# Patient Record
Sex: Female | Born: 1960 | Race: White | Hispanic: No | Marital: Married | Smoking: Former smoker
Health system: Southern US, Community
[De-identification: ages and names within clinical notes are randomized; demographics above are authoritative.]

## PROBLEM LIST (undated history)

## (undated) DIAGNOSIS — N83209 Unspecified ovarian cyst, unspecified side: Secondary | ICD-10-CM

## (undated) DIAGNOSIS — F32A Depression, unspecified: Secondary | ICD-10-CM

## (undated) DIAGNOSIS — T4145XA Adverse effect of unspecified anesthetic, initial encounter: Secondary | ICD-10-CM

## (undated) DIAGNOSIS — I10 Essential (primary) hypertension: Secondary | ICD-10-CM

## (undated) DIAGNOSIS — Z9889 Other specified postprocedural states: Secondary | ICD-10-CM

## (undated) DIAGNOSIS — T8859XA Other complications of anesthesia, initial encounter: Secondary | ICD-10-CM

## (undated) DIAGNOSIS — F329 Major depressive disorder, single episode, unspecified: Secondary | ICD-10-CM

## (undated) DIAGNOSIS — M797 Fibromyalgia: Secondary | ICD-10-CM

## (undated) DIAGNOSIS — I219 Acute myocardial infarction, unspecified: Secondary | ICD-10-CM

## (undated) DIAGNOSIS — R87619 Unspecified abnormal cytological findings in specimens from cervix uteri: Secondary | ICD-10-CM

## (undated) DIAGNOSIS — R112 Nausea with vomiting, unspecified: Secondary | ICD-10-CM

## (undated) HISTORY — DX: Unspecified ovarian cyst, unspecified side: N83.209

## (undated) HISTORY — PX: OVARIAN CYST SURGERY: SHX726

## (undated) HISTORY — DX: Fibromyalgia: M79.7

## (undated) HISTORY — PX: CERVICAL CONE BIOPSY: SUR198

## (undated) HISTORY — DX: Unspecified abnormal cytological findings in specimens from cervix uteri: R87.619

## (undated) HISTORY — PX: APPENDECTOMY: SHX54

## (undated) HISTORY — DX: Major depressive disorder, single episode, unspecified: F32.9

## (undated) HISTORY — DX: Acute myocardial infarction, unspecified: I21.9

## (undated) HISTORY — DX: Essential (primary) hypertension: I10

## (undated) HISTORY — DX: Depression, unspecified: F32.A

---

## 1992-10-28 DIAGNOSIS — R87619 Unspecified abnormal cytological findings in specimens from cervix uteri: Secondary | ICD-10-CM

## 1992-10-28 HISTORY — DX: Unspecified abnormal cytological findings in specimens from cervix uteri: R87.619

## 1994-10-28 HISTORY — PX: BACK SURGERY: SHX140

## 1998-02-07 ENCOUNTER — Ambulatory Visit (HOSPITAL_BASED_OUTPATIENT_CLINIC_OR_DEPARTMENT_OTHER): Admission: RE | Admit: 1998-02-07 | Discharge: 1998-02-07 | Payer: Self-pay | Admitting: Plastic Surgery

## 2000-02-21 ENCOUNTER — Other Ambulatory Visit: Admission: RE | Admit: 2000-02-21 | Discharge: 2000-02-21 | Payer: Self-pay | Admitting: Obstetrics and Gynecology

## 2000-10-15 ENCOUNTER — Encounter (INDEPENDENT_AMBULATORY_CARE_PROVIDER_SITE_OTHER): Payer: Self-pay | Admitting: Specialist

## 2000-10-15 ENCOUNTER — Ambulatory Visit (HOSPITAL_BASED_OUTPATIENT_CLINIC_OR_DEPARTMENT_OTHER): Admission: RE | Admit: 2000-10-15 | Discharge: 2000-10-15 | Payer: Self-pay | Admitting: Plastic Surgery

## 2003-04-28 HISTORY — PX: BOWEL RESECTION: SHX1257

## 2004-08-22 ENCOUNTER — Other Ambulatory Visit: Admission: RE | Admit: 2004-08-22 | Discharge: 2004-08-22 | Payer: Self-pay | Admitting: Obstetrics and Gynecology

## 2005-11-27 ENCOUNTER — Other Ambulatory Visit: Admission: RE | Admit: 2005-11-27 | Discharge: 2005-11-27 | Payer: Self-pay | Admitting: Obstetrics & Gynecology

## 2006-12-30 ENCOUNTER — Other Ambulatory Visit: Admission: RE | Admit: 2006-12-30 | Discharge: 2006-12-30 | Payer: Self-pay | Admitting: Obstetrics & Gynecology

## 2008-02-23 ENCOUNTER — Other Ambulatory Visit: Admission: RE | Admit: 2008-02-23 | Discharge: 2008-02-23 | Payer: Self-pay | Admitting: Obstetrics and Gynecology

## 2008-10-28 HISTORY — PX: ABDOMINOPLASTY/PANNICULECTOMY WITH LIPOSUCTION: SHX5577

## 2012-02-03 ENCOUNTER — Other Ambulatory Visit (HOSPITAL_COMMUNITY): Payer: Self-pay | Admitting: Oral and Maxillofacial Surgery

## 2012-02-03 DIAGNOSIS — R609 Edema, unspecified: Secondary | ICD-10-CM

## 2012-02-11 ENCOUNTER — Other Ambulatory Visit: Payer: Self-pay | Admitting: Radiology

## 2012-02-14 ENCOUNTER — Other Ambulatory Visit: Payer: Self-pay | Admitting: Physician Assistant

## 2012-02-14 ENCOUNTER — Other Ambulatory Visit (HOSPITAL_COMMUNITY): Payer: Self-pay

## 2012-02-17 ENCOUNTER — Encounter (HOSPITAL_COMMUNITY): Payer: Self-pay | Admitting: Pharmacy Technician

## 2012-02-18 ENCOUNTER — Other Ambulatory Visit: Payer: Self-pay | Admitting: Radiology

## 2012-02-19 ENCOUNTER — Other Ambulatory Visit (HOSPITAL_COMMUNITY): Payer: Self-pay | Admitting: Oral and Maxillofacial Surgery

## 2012-02-19 ENCOUNTER — Ambulatory Visit (HOSPITAL_COMMUNITY)
Admission: RE | Admit: 2012-02-19 | Discharge: 2012-02-19 | Disposition: A | Discharge status: Home / Self Care | Payer: 59 | Source: Ambulatory Visit | Attending: Oral and Maxillofacial Surgery | Admitting: Oral and Maxillofacial Surgery

## 2012-02-19 DIAGNOSIS — R609 Edema, unspecified: Secondary | ICD-10-CM

## 2012-02-19 DIAGNOSIS — K118 Other diseases of salivary glands: Secondary | ICD-10-CM | POA: Insufficient documentation

## 2012-04-27 HISTORY — PX: OTHER SURGICAL HISTORY: SHX169

## 2013-11-23 ENCOUNTER — Encounter: Payer: Self-pay | Admitting: Certified Nurse Midwife

## 2013-11-24 ENCOUNTER — Institutional Professional Consult (permissible substitution): Payer: Self-pay | Admitting: Certified Nurse Midwife

## 2013-11-24 ENCOUNTER — Telehealth: Payer: Self-pay | Admitting: Certified Nurse Midwife

## 2013-11-24 NOTE — Telephone Encounter (Signed)
Patient called and cancelled her AEX for today with D. Hollice Espy. I did not count this as a dnka as it was just scheduled yesterday. Patient will call back to reschedule.

## 2014-01-01 IMAGING — US US SOFT TISSUE HEAD/NECK
1 series · 9 of 9 positions shown · non-contrast
Comparison: Prior ultrasound of the neck dated 09/25/2011 and CT
of the neck dated 10/17/2011 performed at [facility] [REDACTED]

CLINICAL DATA: 13 mm hypoechoic nodule demonstrated in the right
submandibular region by prior ultrasound. The patient presents for
possible biopsy.

ULTRASOUND OF HEAD/NECK SOFT TISSUES
TECHNIQUE: Ultrasound examination of the head and neck soft
tissues was performed in the area of clinical concern.

[Series 1: us soft tissue head/neck · 0.08mm/px · 9 of 9 slices shown]
[im 1/9]
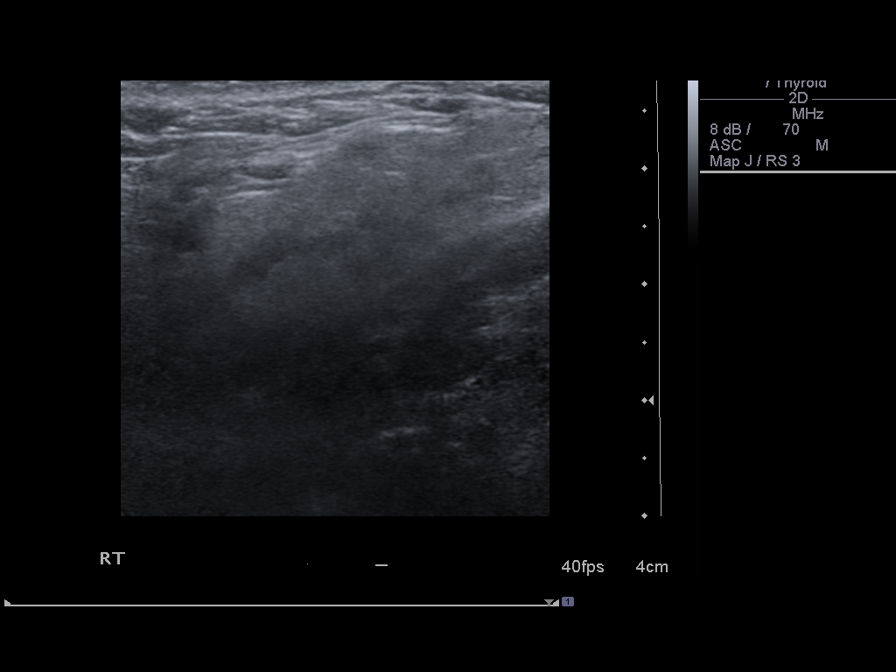
[im 2/9]
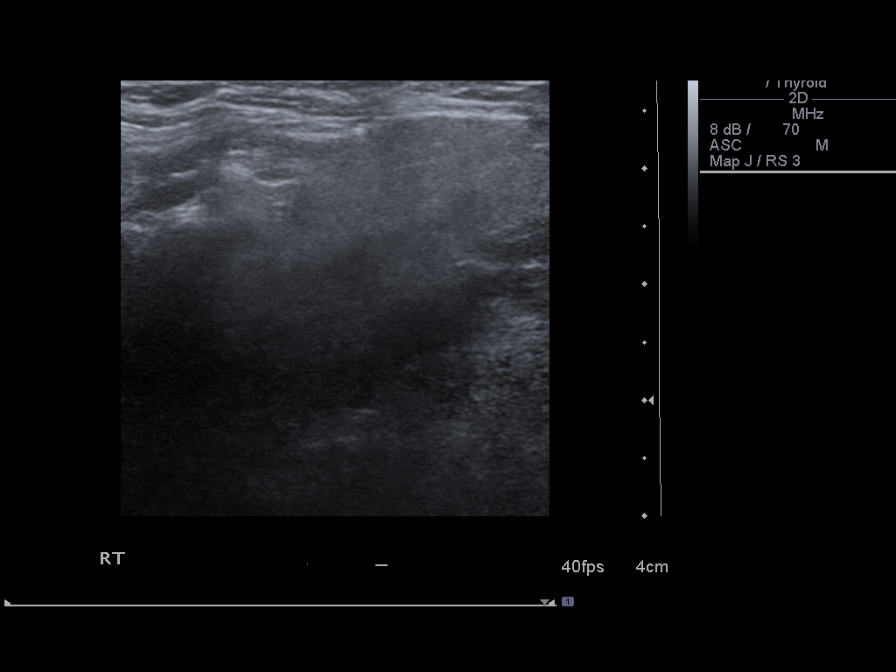
[im 3/9]
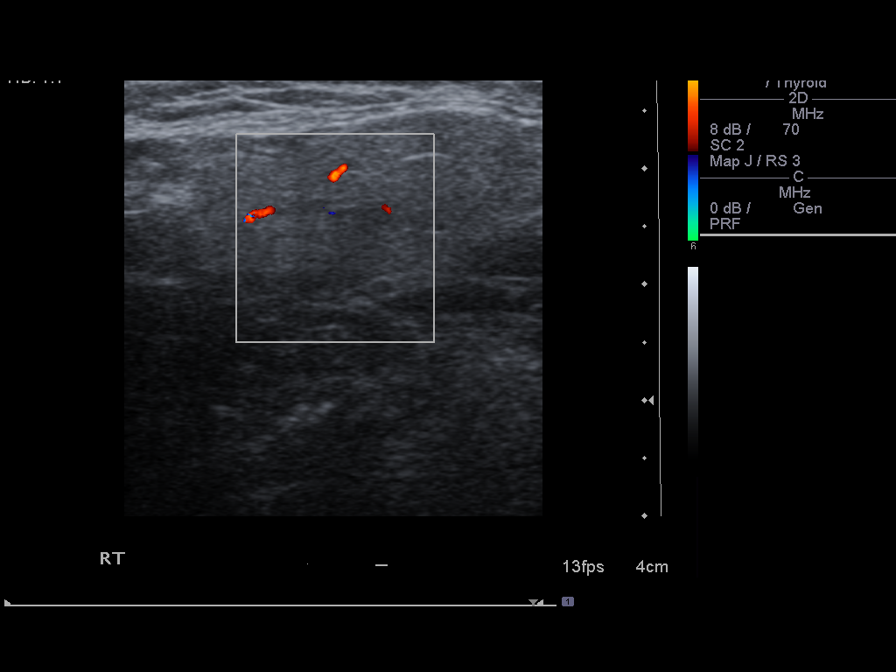
[im 4/9]
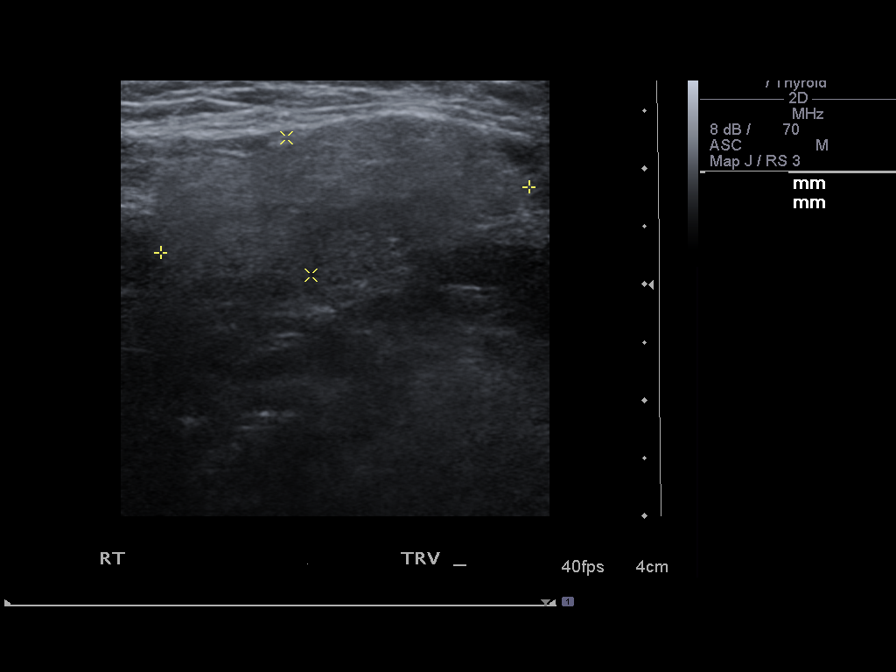
[im 5/9]
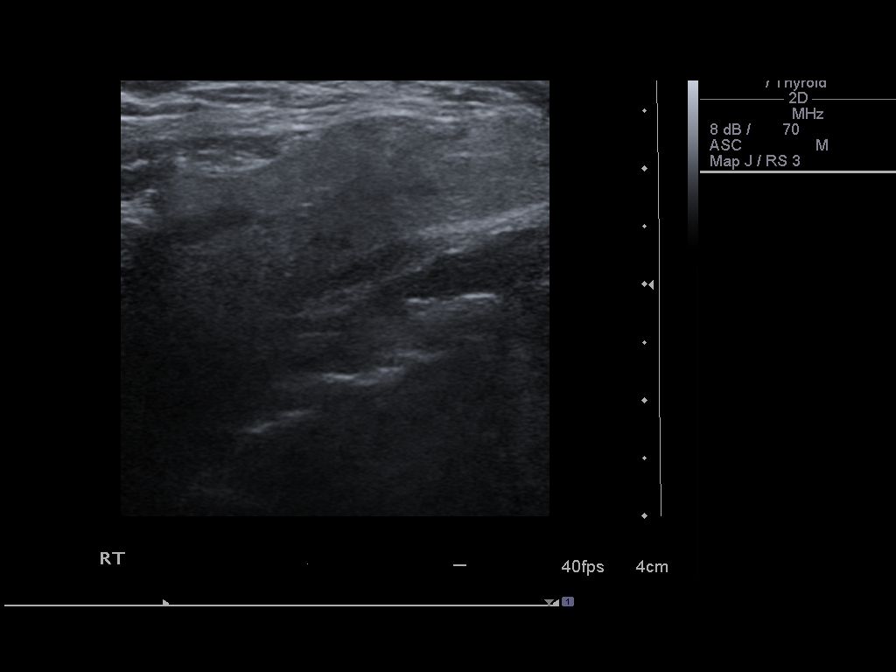
[im 6/9]
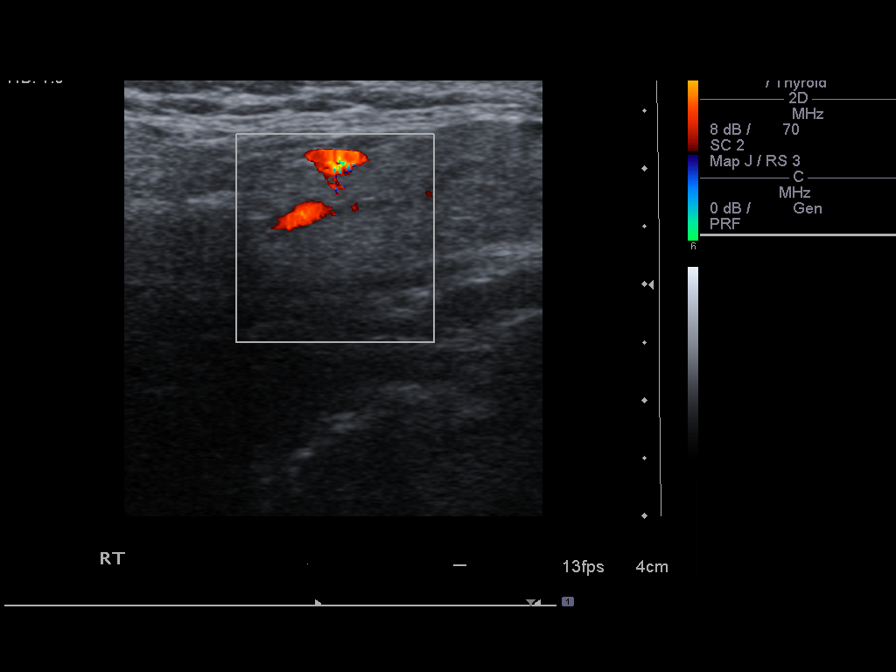
[im 7/9]
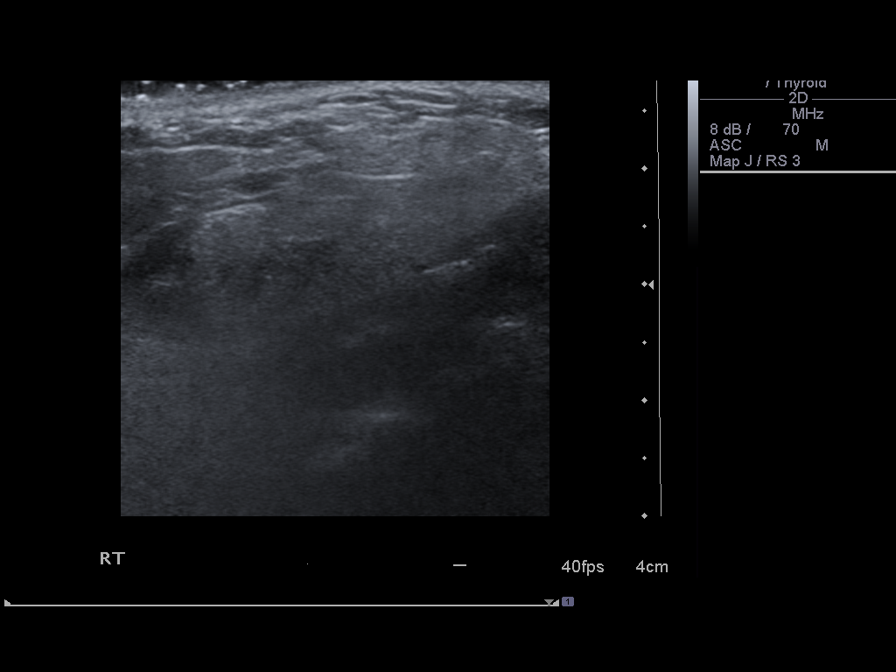
[im 8/9]
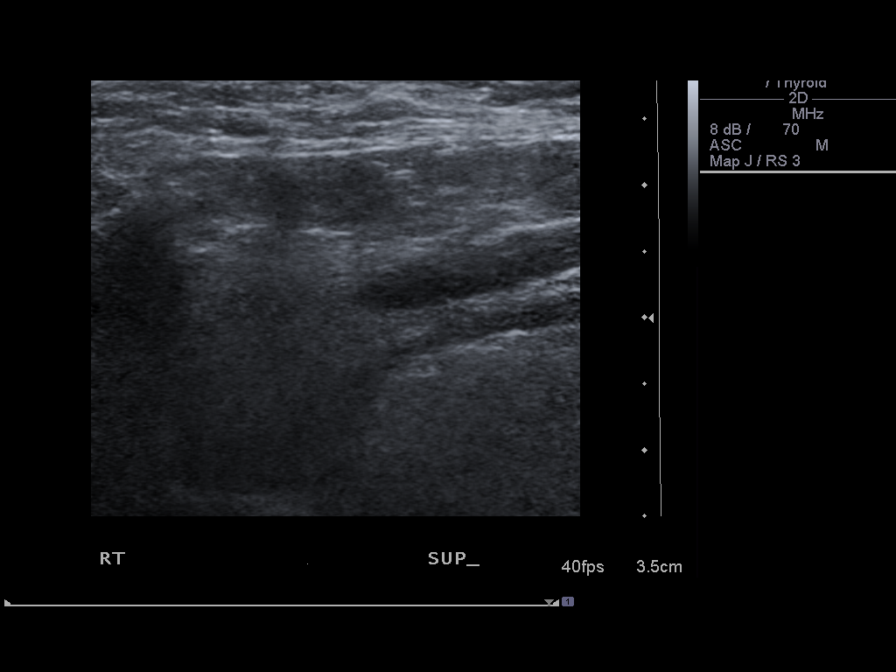
[im 9/9]
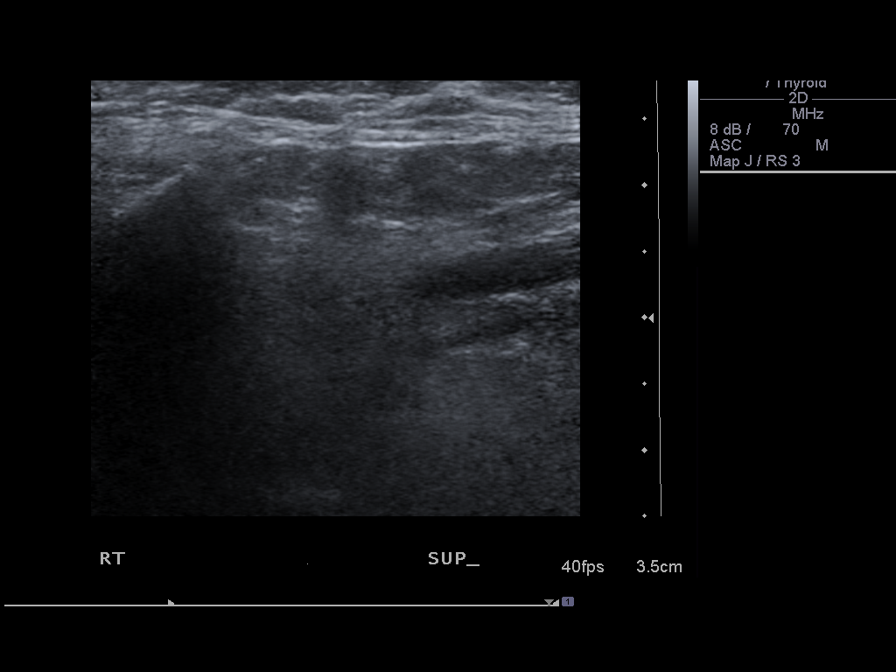

[9 of 9 positions shown; findings below may reference images not displayed]

FINDINGS: Ultrasound was performed in both submandibular regions.
In the right submandibular region, normal salivary tissue, muscle
and subcutaneous fat is present.  There is no evidence of an
abnormal hypoechoic mass or enlarged lymph node.  Biopsy was
therefore not performed.
IMPRESSION: Resolution of previously demonstrated hypoechoic soft tissue nodule
in the right submandibular region by prior ultrasound.  No abnormal
soft tissue mass is identified.

## 2014-07-13 ENCOUNTER — Ambulatory Visit (INDEPENDENT_AMBULATORY_CARE_PROVIDER_SITE_OTHER): Payer: 59 | Admitting: Certified Nurse Midwife

## 2014-07-13 ENCOUNTER — Encounter: Payer: Self-pay | Admitting: Certified Nurse Midwife

## 2014-07-13 VITALS — BP 116/70 | HR 72 | Resp 16 | Ht 63.25 in | Wt 167.0 lb

## 2014-07-13 DIAGNOSIS — N852 Hypertrophy of uterus: Secondary | ICD-10-CM

## 2014-07-13 DIAGNOSIS — Z01419 Encounter for gynecological examination (general) (routine) without abnormal findings: Secondary | ICD-10-CM

## 2014-07-13 DIAGNOSIS — Z124 Encounter for screening for malignant neoplasm of cervix: Secondary | ICD-10-CM

## 2014-07-13 NOTE — Progress Notes (Signed)
53 y.o. G46P3003 Married Caucasian Fe here for annual exam.  Menopausal no HRT. Denies vaginal bleeding or vaginal dryness. Continues to have some hot flashes, but less, no issues. Sees PCP for aex, medication management for Hypertension, and labs. All normal this year. History of fibroid uterus, not symptomatic. No health issues today.  Patient's last menstrual period was 05/28/2013.          Sexually active: Yes.    The current method of family planning is vasectomy.    Exercising: Yes.    exercise Smoker:  no  Health Maintenance: Pap:  05-28-12 neg HPVHR negative MMG:  11-24-13 composition category b, birads category 1:neg Colonoscopy: 10/12 polyp f/u 88yrs BMD:   2012 TDaP:  2013 Labs: none Self breast exam: done occ   reports that she has quit smoking. She does not have any smokeless tobacco history on file. She reports that she drinks about 6 ounces of alcohol per week. She reports that she does not use illicit drugs.  Past Medical History  Diagnosis Date  . Hypertension   . Abnormal Pap smear of cervix 1994    cin3  . Fibromyalgia   . Depression     Past Surgical History  Procedure Laterality Date  . Back surgery  1996  . Bowel resection  7/04  . Salivary gland removal Right 7/13  . Cervical cone biopsy  1993?    Current Outpatient Prescriptions  Medication Sig Dispense Refill  . cetirizine (ZYRTEC) 10 MG tablet Take 10 mg by mouth daily as needed. For seasonal allergies      . Cyanocobalamin (VITAMIN B-12 IJ) Inject as directed every 14 (fourteen) days.      . Diazepam (VALIUM PO) Take by mouth as needed.      Marland Kitchen escitalopram (LEXAPRO) 10 MG tablet Take 10 mg by mouth daily.      . fexofenadine (ALLEGRA) 60 MG tablet Take 60 mg by mouth daily as needed. For seasonal allergies      . ibuprofen (ADVIL,MOTRIN) 200 MG tablet Take 200 mg by mouth every 6 (six) hours as needed. For pain      . losartan-hydrochlorothiazide (HYZAAR) 50-12.5 MG per tablet Take 1 tablet by mouth  daily.      . Probiotic Product (ALIGN PO) Take by mouth as needed.       . zolpidem (AMBIEN) 5 MG tablet Take 2.5 mg by mouth at bedtime as needed. For sleep       No current facility-administered medications for this visit.    Family History  Problem Relation Age of Onset  . Hypertension Mother   . Stroke Mother   . Cancer Father     lung  . Hypertension Sister   . Hypertension Brother   . Hypertension Maternal Grandmother     ROS:  Pertinent items are noted in HPI.  Otherwise, a comprehensive ROS was negative.  Exam:   BP 116/70  Pulse 72  Resp 16  Ht 5' 3.25" (1.607 m)  Wt 167 lb (75.751 kg)  BMI 29.33 kg/m2  LMP 05/28/2013 Height: 5' 3.25" (160.7 cm)  Ht Readings from Last 3 Encounters:  07/13/14 5' 3.25" (1.607 m)    General appearance: alert, cooperative and appears stated age Head: Normocephalic, without obvious abnormality, atraumatic Neck: no adenopathy, supple, symmetrical, trachea midline and thyroid normal to inspection and palpation Lungs: clear to auscultation bilaterally Breasts: normal appearance, no masses or tenderness, No nipple retraction or dimpling, No nipple discharge or bleeding, No axillary  or supraclavicular adenopathy Heart: regular rate and rhythm Abdomen: soft, non-tender; no masses,  no organomegaly Extremities: extremities normal, atraumatic, no cyanosis or edema Skin: Skin color, texture, turgor normal. No rashes or lesions Lymph nodes: Cervical, supraclavicular, and axillary nodes normal. No abnormal inguinal nodes palpated Neurologic: Grossly normal   Pelvic: External genitalia:  no lesions              Urethra:  normal appearing urethra with no masses, tenderness or lesions              Bartholin's and Skene's: normal                 Vagina: normal appearing vagina with normal color and discharge, no lesions              Cervix: normal, non tender, no lesions              Pap taken: Yes.   Bimanual Exam:  Uterus:  enlarged, mid  position weeks size and non tender              Adnexa: normal adnexa and no mass, fullness, tenderness               Rectovaginal: Confirms               Anus:  normal sphincter tone, no lesions  A:  Well Woman with normal exam  Menopausal no HRT  History of fibroid uterus no size change  History of CIN 3 1994  Hypertension stable medication with PCP management  P:   Reviewed health and wellness pertinent to exam  Aware of need to evaluate if vaginal bleeding  Pap smear taken today with HPVHR  Continue follow up as indicated   counseled on breast self exam, mammography screening, menopause, adequate intake of calcium and vitamin D, diet and exercise  return annually or prn  An After Visit Summary was printed and given to the patient.

## 2014-07-13 NOTE — Patient Instructions (Signed)

## 2014-07-18 LAB — IPS PAP TEST WITH HPV

## 2014-07-18 NOTE — Progress Notes (Signed)
Reviewed personally.  M. Suzanne Alfa Leibensperger, MD.  

## 2014-08-29 ENCOUNTER — Encounter: Payer: Self-pay | Admitting: Certified Nurse Midwife

## 2015-01-02 DIAGNOSIS — Z0289 Encounter for other administrative examinations: Secondary | ICD-10-CM

## 2015-05-19 DIAGNOSIS — F418 Other specified anxiety disorders: Secondary | ICD-10-CM | POA: Insufficient documentation

## 2015-05-19 DIAGNOSIS — I10 Essential (primary) hypertension: Secondary | ICD-10-CM | POA: Insufficient documentation

## 2015-05-19 DIAGNOSIS — K219 Gastro-esophageal reflux disease without esophagitis: Secondary | ICD-10-CM | POA: Insufficient documentation

## 2015-05-19 DIAGNOSIS — I2510 Atherosclerotic heart disease of native coronary artery without angina pectoris: Secondary | ICD-10-CM | POA: Insufficient documentation

## 2015-06-01 DIAGNOSIS — Z955 Presence of coronary angioplasty implant and graft: Secondary | ICD-10-CM | POA: Insufficient documentation

## 2015-06-20 DIAGNOSIS — I2510 Atherosclerotic heart disease of native coronary artery without angina pectoris: Secondary | ICD-10-CM | POA: Insufficient documentation

## 2015-08-02 ENCOUNTER — Ambulatory Visit: Payer: Self-pay | Admitting: Certified Nurse Midwife

## 2015-08-02 ENCOUNTER — Encounter: Payer: Self-pay | Admitting: Certified Nurse Midwife

## 2015-08-14 DIAGNOSIS — I10 Essential (primary) hypertension: Secondary | ICD-10-CM | POA: Insufficient documentation

## 2015-08-31 DIAGNOSIS — K635 Polyp of colon: Secondary | ICD-10-CM | POA: Insufficient documentation

## 2015-11-21 ENCOUNTER — Ambulatory Visit: Payer: Self-pay | Admitting: Certified Nurse Midwife

## 2016-02-23 ENCOUNTER — Encounter: Payer: Self-pay | Admitting: Certified Nurse Midwife

## 2016-02-23 ENCOUNTER — Ambulatory Visit (INDEPENDENT_AMBULATORY_CARE_PROVIDER_SITE_OTHER): Payer: 59 | Admitting: Certified Nurse Midwife

## 2016-02-23 VITALS — Ht 63.25 in | Wt 156.0 lb

## 2016-02-23 DIAGNOSIS — N951 Menopausal and female climacteric states: Secondary | ICD-10-CM | POA: Diagnosis not present

## 2016-02-23 DIAGNOSIS — Z01419 Encounter for gynecological examination (general) (routine) without abnormal findings: Secondary | ICD-10-CM | POA: Diagnosis not present

## 2016-02-23 DIAGNOSIS — Z8742 Personal history of other diseases of the female genital tract: Secondary | ICD-10-CM | POA: Diagnosis not present

## 2016-02-23 DIAGNOSIS — Z124 Encounter for screening for malignant neoplasm of cervix: Secondary | ICD-10-CM

## 2016-02-23 NOTE — Patient Instructions (Signed)

## 2016-02-23 NOTE — Progress Notes (Signed)
55 y.o. G43P3003 Married  Caucasian Fe here for annual exam.  Menopausal no vaginal. Denies vaginal bleeding. Vaginal dryness at times. Sees Cardiology for follow up of heart attack and stent placement in 2016, doing well. Does her labs. Sees PCP for hypertension/cholesterol and anxiety /depression. Plans colonoscopy this year. Busy with family and grandchildren. No other health concerns today.  LMP    05/28/2013     Sexually active: Yes.    The current method of family planning is vasectomy.    Exercising: Yes.    walking Smoker:  no  Health Maintenance: Pap:  07-13-14 neg no endos, hpv hr neg MMG:  11-30-15 category b density,birads 1:neg Colonoscopy:  2012 polyp f/u 1yrs due this year BMD:   2012 TDaP:  2013 Shingles: no Pneumonia: no Hep C and HIV: not done declines Labs: none Self breast exam: done occ   reports that she has quit smoking. She does not have any smokeless tobacco history on file. She reports that she drinks about 4.8 oz of alcohol per week. She reports that she does not use illicit drugs.  Past Medical History  Diagnosis Date  . Hypertension   . Abnormal Pap smear of cervix 1994    cin3  . Fibromyalgia   . Depression     Past Surgical History  Procedure Laterality Date  . Back surgery  1996  . Bowel resection  7/04  . Salivary gland removal Right 7/13  . Cervical cone biopsy  1993?    Current Outpatient Prescriptions  Medication Sig Dispense Refill  . Acetaminophen (TYLENOL PO) Take by mouth as needed.    . ALPRAZolam (XANAX) 1 MG tablet   0  . aspirin 81 MG tablet Take 81 mg by mouth daily.    Marland Kitchen atorvastatin (LIPITOR) 40 MG tablet   3  . cetirizine (ZYRTEC) 10 MG tablet Take 10 mg by mouth daily as needed. For seasonal allergies    . cyanocobalamin (,VITAMIN B-12,) 1000 MCG/ML injection INJECT 1 ML INTO MUSCLE Q 2 WEEKS UTD  5  . EFFIENT 10 MG TABS tablet TK 1 T PO DAILY.  11  . escitalopram (LEXAPRO) 20 MG tablet     . fexofenadine (ALLEGRA) 60 MG  tablet Take 60 mg by mouth daily as needed. For seasonal allergies    . fluticasone (FLONASE) 50 MCG/ACT nasal spray SHAKE LQ AND U 2 SPRAYS IEN D FOR 10 DAYS  1  . losartan-hydrochlorothiazide (HYZAAR) 50-12.5 MG per tablet Take 1 tablet by mouth daily.    . Probiotic Product (ALIGN PO) Take by mouth as needed.     . Diazepam (VALIUM PO) Take by mouth as needed. Reported on 02/23/2016     No current facility-administered medications for this visit.    Family History  Problem Relation Age of Onset  . Hypertension Mother   . Stroke Mother   . Cancer Father     lung  . Hypertension Sister   . Hypertension Brother   . Hypertension Maternal Grandmother     ROS:  Pertinent items are noted in HPI.  Otherwise, a comprehensive ROS was negative.  Exam:   Ht 5' 3.25" (1.607 m)  Wt 156 lb (70.761 kg)  BMI 27.40 kg/m2  LMP  Height: 5' 3.25" (160.7 cm) Ht Readings from Last 3 Encounters:  02/23/16 5' 3.25" (1.607 m)  07/13/14 5' 3.25" (1.607 m)    General appearance: alert, cooperative and appears stated age Head: Normocephalic, without obvious abnormality, atraumatic Neck: no  adenopathy, supple, symmetrical, trachea midline and thyroid normal to inspection and palpation Lungs: clear to auscultation bilaterally Breasts: normal appearance, no masses or tenderness, No nipple retraction or dimpling, No nipple discharge or bleeding, No axillary or supraclavicular adenopathy Heart: regular rate and rhythm Abdomen: soft, non-tender; no masses,  no organomegaly Extremities: extremities normal, atraumatic, no cyanosis or edema Skin: Skin color, texture, turgor normal. No rashes or lesions Lymph nodes: Cervical, supraclavicular, and axillary nodes normal. No abnormal inguinal nodes palpated Neurologic: Grossly normal   Pelvic: External genitalia:  no lesions              Urethra:  normal appearing urethra with no masses, tenderness or lesions              Bartholin's and Skene's: normal                  Vagina: normal appearing vagina with normal color and discharge, no lesions              Cervix: normal,non tender, with cone biopsy appearance              Pap taken: Yes.   Bimanual Exam:  Uterus:  normal size, contour, position, consistency, mobility, non-tender and enlarged, 10-12  weeks size  History of fibroids              Adnexa: normal adnexa and no mass, fullness, tenderness               Rectovaginal: Confirms               Anus:  normal sphincter tone, no lesions  Chaperone present: yes  A:  Well Woman with normal exam  Menopausal no HRT  Vaginal dryness  Enlarged uterus no size change since last exam, history of fibroids  History of CIN 3 with [facility]  Hypertension/cholesterol/anxiety and depression management with MD  P:   Reviewed health and wellness pertinent to exam  Aware of need to advise if vaginal bleeding  Discussed OTC treatment for vaginal dryness and moisture for sexually activity. Patient does not feel it is a problem, just need to increase moisture at times. Patient can try coconut oil or [place] pearls. Will advise if no change or increases.  Discussed no change noted in size. Warning signs given with fibroids. Questions addressed.  Continue with follow up as indicated.  Pap smear as above with HPVHR   counseled on breast self exam, mammography screening, menopause, adequate intake of calcium and vitamin D, diet and exercise  return annually or prn  An After Visit Summary was printed and given to the patient.

## 2016-02-25 NOTE — Progress Notes (Signed)
Encounter reviewed Karielle Davidow, MD   

## 2016-02-27 LAB — IPS PAP TEST WITH HPV

## 2016-03-05 DIAGNOSIS — E7800 Pure hypercholesterolemia, unspecified: Secondary | ICD-10-CM | POA: Insufficient documentation

## 2017-02-27 ENCOUNTER — Encounter: Payer: Self-pay | Admitting: Certified Nurse Midwife

## 2017-08-15 ENCOUNTER — Ambulatory Visit (INDEPENDENT_AMBULATORY_CARE_PROVIDER_SITE_OTHER): Payer: 59 | Admitting: Certified Nurse Midwife

## 2017-08-15 ENCOUNTER — Encounter: Payer: Self-pay | Admitting: Certified Nurse Midwife

## 2017-08-15 VITALS — BP 102/60 | HR 88 | Resp 14 | Ht 63.25 in | Wt 165.0 lb

## 2017-08-15 DIAGNOSIS — Z01419 Encounter for gynecological examination (general) (routine) without abnormal findings: Secondary | ICD-10-CM | POA: Diagnosis not present

## 2017-08-15 DIAGNOSIS — E538 Deficiency of other specified B group vitamins: Secondary | ICD-10-CM | POA: Insufficient documentation

## 2017-08-15 DIAGNOSIS — N951 Menopausal and female climacteric states: Secondary | ICD-10-CM

## 2017-08-15 DIAGNOSIS — Z87898 Personal history of other specified conditions: Secondary | ICD-10-CM | POA: Insufficient documentation

## 2017-08-15 DIAGNOSIS — K589 Irritable bowel syndrome without diarrhea: Secondary | ICD-10-CM | POA: Insufficient documentation

## 2017-08-15 DIAGNOSIS — Z86018 Personal history of other benign neoplasm: Secondary | ICD-10-CM

## 2017-08-15 DIAGNOSIS — N852 Hypertrophy of uterus: Secondary | ICD-10-CM | POA: Diagnosis not present

## 2017-08-15 DIAGNOSIS — H35049 Retinal micro-aneurysms, unspecified, unspecified eye: Secondary | ICD-10-CM | POA: Insufficient documentation

## 2017-08-15 DIAGNOSIS — D219 Benign neoplasm of connective and other soft tissue, unspecified: Secondary | ICD-10-CM | POA: Insufficient documentation

## 2017-08-15 DIAGNOSIS — Q0700 Arnold-Chiari syndrome without spina bifida or hydrocephalus: Secondary | ICD-10-CM | POA: Insufficient documentation

## 2017-08-15 DIAGNOSIS — M797 Fibromyalgia: Secondary | ICD-10-CM | POA: Insufficient documentation

## 2017-08-15 DIAGNOSIS — G4700 Insomnia, unspecified: Secondary | ICD-10-CM | POA: Insufficient documentation

## 2017-08-15 NOTE — Progress Notes (Signed)
56 y.o. G73P3003 Married  Caucasian Fe here for annual exam. Menopausal no HRT. Denies vaginal bleeding or vaginal dryness. Some hot flashes and night sweats, but no issues. Does not feel fibroids have changed, no symptom changes. Continues follow up with cardiology since heart attack and stent placement. Continues to work of diet and weight to stay healthy. Feels like her exercise is with family care provided.  Sees PCP for cholesterol, anxiety/depression and hypertension management, all stable per patient. Had planned colonoscopy last year, but her health was not ready to do. Plans to do before year end. No other health issues today.  Patient's last menstrual period was 05/28/2013.          Sexually active: Yes.    The current method of family planning is vasectomy.    Exercising: No.  The patient does not participate in regular exercise at present. Smoker:  no  Health Maintenance: Pap:  02-23-16 neg HPV HR neg History of Abnormal Pap: yes, years ago MMG:  02-20-17 category b density birads 2:neg Self Breast exams: no Colonoscopy:  2012 f/u 30yrs BMD:   2012 TDaP:  2013 Shingles: n/a Pneumonia: n/a Hep C: done with PCP -- negative Labs: PCP   reports that she has quit smoking. She has never used smokeless tobacco. She reports that she drinks about 4.8 oz of alcohol per week . She reports that she does not use drugs.  Past Medical History:  Diagnosis Date  . Abnormal Pap smear of cervix 1994   cin3  . Depression   . Fibromyalgia   . Heart attack ([place])   . Hypertension     Past Surgical History:  Procedure Laterality Date  . BACK SURGERY  1996  . BOWEL RESECTION  7/04  . CERVICAL CONE BIOPSY  1993?  . salivary gland removal Right 7/13    Current Outpatient Prescriptions  Medication Sig Dispense Refill  . Acetaminophen (TYLENOL PO) Take by mouth as needed.    . ALPRAZolam (XANAX) 1 MG tablet   0  . aspirin 81 MG tablet Take 81 mg by mouth daily.    Marland Kitchen azithromycin (ZITHROMAX)  250 MG tablet Take two tablets on the first day and then one tablet every day after.    . bifidobacterium infantis (ALIGN) capsule Take 4 mg by mouth as needed.    . carvedilol (COREG) 6.25 MG tablet Take 3.2 mg by mouth at bedtime.    . cetirizine (ZYRTEC) 10 MG tablet Take 10 mg by mouth daily as needed. For seasonal allergies    . cyanocobalamin (,VITAMIN B-12,) 1000 MCG/ML injection INJECT 1 ML INTO MUSCLE Q 2 WEEKS UTD  5  . Diazepam (VALIUM PO) Take by mouth as needed. Reported on 02/23/2016    . EFFIENT 10 MG TABS tablet TK 1 T PO DAILY.  11  . escitalopram (LEXAPRO) 20 MG tablet     . fexofenadine (ALLEGRA) 60 MG tablet Take 60 mg by mouth daily as needed. For seasonal allergies    . fluticasone (FLONASE) 50 MCG/ACT nasal spray SHAKE LQ AND U 2 SPRAYS IEN D FOR 10 DAYS  1  . hyoscyamine (LEVSIN SL) 0.125 MG SL tablet Take 0.125 mg by mouth as needed.    Marland Kitchen losartan-hydrochlorothiazide (HYZAAR) 50-12.5 MG per tablet Take 1 tablet by mouth daily.    . metroNIDAZOLE (METROGEL) 0.75 % gel as needed.    . nitroGLYCERIN (NITROSTAT) 0.4 MG SL tablet Place under the tongue as needed.    . Probiotic  Product (ALIGN PO) Take by mouth as needed.     . rosuvastatin (CRESTOR) 20 MG tablet Take 20 mg by mouth daily.     No current facility-administered medications for this visit.     Family History  Problem Relation Age of Onset  . Hypertension Mother   . Stroke Mother   . Cancer Father        lung  . Hypertension Sister   . Hypertension Brother   . Hypertension Maternal Grandmother   . Colon cancer Maternal Aunt     ROS:  Pertinent items are noted in HPI.  Otherwise, a comprehensive ROS was negative.  Exam:   BP 102/60 (BP Location: Right Arm, Patient Position: Sitting, Cuff Size: Normal)   Pulse 88   Resp 14   Ht 5' 3.25" (1.607 m)   Wt 165 lb (74.8 kg)   LMP 05/28/2013   BMI 29.00 kg/m  Height: 5' 3.25" (160.7 cm) Ht Readings from Last 3 Encounters:  08/15/17 5' 3.25" (1.607 m)   02/23/16 5' 3.25" (1.607 m)  07/13/14 5' 3.25" (1.607 m)    General appearance: alert, cooperative and appears stated age Head: Normocephalic, without obvious abnormality, atraumatic Neck: no adenopathy, supple, symmetrical, trachea midline and thyroid normal to inspection and palpation Lungs: clear to auscultation bilaterally Breasts: normal appearance, no masses or tenderness, No nipple retraction or dimpling, No nipple discharge or bleeding, No axillary or supraclavicular adenopathy Heart: regular rate and rhythm Abdomen: soft, non-tender; no masses,  no organomegaly Extremities: extremities normal, atraumatic, no cyanosis or edema Skin: Skin color, texture, turgor normal. No rashes or lesions Lymph nodes: Cervical, supraclavicular, and axillary nodes normal. No abnormal inguinal nodes palpated Neurologic: Grossly normal   Pelvic: External genitalia:  no lesions              Urethra:  normal appearing urethra with no masses, tenderness or lesions              Bartholin's and Skene's: normal                 Vagina: normal appearing vagina with normal color and discharge, no lesions              Cervix: multiparous appearance, no cervical motion tenderness and no lesions              Pap taken: No. Bimanual Exam:  Uterus:  enlarged, 14 week size, non  tender,fibroid feel weeks size              Adnexa: normal adnexa and no mass, fullness, tenderness               Rectovaginal: Confirms               Anus:  normal sphincter tone, no lesions  Chaperone present: yes  A:  Well Woman with normal exam  Menopausal no HRT  Enlarged uterus known 14 week size history of fibroids(no size change from previous exam)  History of heart attack with stent placement 2016 doing well with cardiology follow up  Hypertension/cholesterol/anxiety with PCP management  Colonoscopy due    P:   Reviewed health and wellness pertinent to exam  Aware of need to advise if vaginal bleeding  Discussed  warning signs with fibroids and need to advise if occurs or feels size or symptoms have changed  Continue follow up with MD as indicated for health issues  Patient plans to schedule prior to end of year.  Pap  smear: no   counseled on breast self exam, mammography screening, feminine hygiene, adequate intake of calcium and vitamin D, diet and exercise  return annually or prn  An After Visit Summary was printed and given to the patient.

## 2017-08-15 NOTE — Patient Instructions (Signed)

## 2017-09-16 DIAGNOSIS — Z23 Encounter for immunization: Secondary | ICD-10-CM | POA: Diagnosis not present

## 2017-10-15 DIAGNOSIS — R05 Cough: Secondary | ICD-10-CM | POA: Diagnosis not present

## 2017-10-15 DIAGNOSIS — J209 Acute bronchitis, unspecified: Secondary | ICD-10-CM | POA: Diagnosis not present

## 2017-10-15 DIAGNOSIS — R062 Wheezing: Secondary | ICD-10-CM | POA: Diagnosis not present

## 2017-11-06 DIAGNOSIS — J069 Acute upper respiratory infection, unspecified: Secondary | ICD-10-CM | POA: Diagnosis not present

## 2017-11-06 DIAGNOSIS — H6983 Other specified disorders of Eustachian tube, bilateral: Secondary | ICD-10-CM | POA: Diagnosis not present

## 2017-11-27 DIAGNOSIS — R0989 Other specified symptoms and signs involving the circulatory and respiratory systems: Secondary | ICD-10-CM | POA: Diagnosis not present

## 2017-11-27 DIAGNOSIS — J209 Acute bronchitis, unspecified: Secondary | ICD-10-CM | POA: Diagnosis not present

## 2017-11-27 DIAGNOSIS — Z87891 Personal history of nicotine dependence: Secondary | ICD-10-CM | POA: Diagnosis not present

## 2017-11-27 DIAGNOSIS — R05 Cough: Secondary | ICD-10-CM | POA: Diagnosis not present

## 2017-11-27 DIAGNOSIS — Z7722 Contact with and (suspected) exposure to environmental tobacco smoke (acute) (chronic): Secondary | ICD-10-CM | POA: Diagnosis not present

## 2017-12-01 DIAGNOSIS — R05 Cough: Secondary | ICD-10-CM | POA: Diagnosis not present

## 2017-12-01 DIAGNOSIS — G4710 Hypersomnia, unspecified: Secondary | ICD-10-CM | POA: Diagnosis not present

## 2017-12-01 DIAGNOSIS — R0683 Snoring: Secondary | ICD-10-CM | POA: Diagnosis not present

## 2017-12-02 DIAGNOSIS — R635 Abnormal weight gain: Secondary | ICD-10-CM | POA: Diagnosis not present

## 2017-12-02 DIAGNOSIS — N951 Menopausal and female climacteric states: Secondary | ICD-10-CM | POA: Diagnosis not present

## 2017-12-08 DIAGNOSIS — I2510 Atherosclerotic heart disease of native coronary artery without angina pectoris: Secondary | ICD-10-CM | POA: Diagnosis not present

## 2017-12-08 DIAGNOSIS — R7301 Impaired fasting glucose: Secondary | ICD-10-CM | POA: Diagnosis not present

## 2017-12-08 DIAGNOSIS — I10 Essential (primary) hypertension: Secondary | ICD-10-CM | POA: Diagnosis not present

## 2017-12-08 DIAGNOSIS — E663 Overweight: Secondary | ICD-10-CM | POA: Diagnosis not present

## 2017-12-10 DIAGNOSIS — G4710 Hypersomnia, unspecified: Secondary | ICD-10-CM | POA: Diagnosis not present

## 2017-12-17 DIAGNOSIS — K219 Gastro-esophageal reflux disease without esophagitis: Secondary | ICD-10-CM | POA: Diagnosis not present

## 2017-12-17 DIAGNOSIS — K3189 Other diseases of stomach and duodenum: Secondary | ICD-10-CM | POA: Diagnosis not present

## 2017-12-17 DIAGNOSIS — Z1211 Encounter for screening for malignant neoplasm of colon: Secondary | ICD-10-CM | POA: Diagnosis not present

## 2017-12-17 DIAGNOSIS — R194 Change in bowel habit: Secondary | ICD-10-CM | POA: Diagnosis not present

## 2017-12-17 DIAGNOSIS — Z800 Family history of malignant neoplasm of digestive organs: Secondary | ICD-10-CM | POA: Diagnosis not present

## 2017-12-17 DIAGNOSIS — K6389 Other specified diseases of intestine: Secondary | ICD-10-CM | POA: Diagnosis not present

## 2017-12-19 DIAGNOSIS — G4719 Other hypersomnia: Secondary | ICD-10-CM | POA: Diagnosis not present

## 2017-12-19 DIAGNOSIS — G4733 Obstructive sleep apnea (adult) (pediatric): Secondary | ICD-10-CM | POA: Diagnosis not present

## 2017-12-19 DIAGNOSIS — I10 Essential (primary) hypertension: Secondary | ICD-10-CM | POA: Diagnosis not present

## 2018-03-04 DIAGNOSIS — Z955 Presence of coronary angioplasty implant and graft: Secondary | ICD-10-CM | POA: Diagnosis not present

## 2018-03-04 DIAGNOSIS — I10 Essential (primary) hypertension: Secondary | ICD-10-CM | POA: Diagnosis not present

## 2018-03-04 DIAGNOSIS — E7800 Pure hypercholesterolemia, unspecified: Secondary | ICD-10-CM | POA: Diagnosis not present

## 2018-03-04 DIAGNOSIS — E538 Deficiency of other specified B group vitamins: Secondary | ICD-10-CM | POA: Diagnosis not present

## 2018-03-30 ENCOUNTER — Telehealth: Payer: Self-pay | Admitting: Certified Nurse Midwife

## 2018-03-30 NOTE — Telephone Encounter (Signed)
Spoke with patient. Reports soft, orange size, immobile lump, right side of pelvis. Noticed the area last week. Last BM 03/30/18. Reports increased constipation over the last wk, usually has a softer stool with her hx of IBS. Abdomen is soft.   Denies pain, skin changes, fever/chills, N/V vaginal bleeding, urinary changes.   Recommended OV for further evaluation, scheduled with Melvia Heaps, CNM on 6/4 at 11am. Advised patient Melvia Heaps, CNM is out of the office today, will review with covering provider and return call if any additional recommendations.    Routing to covering provider for final review. Patient is agreeable to disposition. Will close encounter.  Cc: Melvia Heaps, CNM

## 2018-03-30 NOTE — Telephone Encounter (Signed)
Patient stated that she has uterine fibroid tumors and she can feel something in the lower right side of her abdomen. Patient stated no pain.

## 2018-03-31 ENCOUNTER — Ambulatory Visit: Payer: BLUE CROSS/BLUE SHIELD | Admitting: Certified Nurse Midwife

## 2018-03-31 ENCOUNTER — Ambulatory Visit: Payer: Self-pay | Admitting: Certified Nurse Midwife

## 2018-03-31 ENCOUNTER — Encounter: Payer: Self-pay | Admitting: Certified Nurse Midwife

## 2018-03-31 ENCOUNTER — Other Ambulatory Visit: Payer: Self-pay

## 2018-03-31 VITALS — BP 102/64 | HR 68 | Resp 16 | Ht 63.25 in | Wt 172.0 lb

## 2018-03-31 DIAGNOSIS — N949 Unspecified condition associated with female genital organs and menstrual cycle: Secondary | ICD-10-CM | POA: Diagnosis not present

## 2018-03-31 DIAGNOSIS — Z86018 Personal history of other benign neoplasm: Secondary | ICD-10-CM | POA: Diagnosis not present

## 2018-03-31 DIAGNOSIS — N852 Hypertrophy of uterus: Secondary | ICD-10-CM

## 2018-03-31 NOTE — Progress Notes (Signed)
  Subjective:     Patient ID: Brandi Rogers, female   DOB: May 05, 1961, 57 y.o.   MRN: [id number]  56 Yo white menopausal female here complaining of feeling lump on abdomen on right side. No pain noted. Has some constipation and now on magnesium which has helped. Denies abdominal pain. Patient noted area while laying on her back. Patient can't feel the same area that she felt today. Can feel where uterus is enlarged only. Denies vaginal bleeding . Last office visit 10/18.     Review of Systems  Constitutional: Negative for activity change, appetite change, fatigue and fever.       Now on celiac diet which has changed her bowel habits only  Gastrointestinal: Positive for constipation. Negative for abdominal distention, abdominal pain, blood in stool, diarrhea, nausea and vomiting.  Genitourinary: Negative for dysuria, pelvic pain, vaginal bleeding and vaginal pain.  Skin: Negative.        Objective:   Physical Exam  Constitutional: She is oriented to person, place, and time. She appears well-developed and well-nourished.  Abdominal: Soft. Bowel sounds are normal. She exhibits mass. She exhibits no distension. There is no tenderness. There is no rebound and no guarding. No hernia.  Mass is uterus with known enlargement 14 week size  Genitourinary: Pelvic exam was performed with patient supine. There is no rash, tenderness, lesion or injury on the right labia. There is no rash, tenderness, lesion or injury on the left labia. Uterus is enlarged. Uterus is not tender. Cervix exhibits no motion tenderness and no discharge. Right adnexum displays fullness. Right adnexum displays no tenderness. Left adnexum displays fullness. Left adnexum displays no tenderness. No tenderness in the vagina. No vaginal discharge found.  Genitourinary Comments: Uterus enlarged at 14 week size with fullness in left. Known fibroids. Questionable size change  Lymphadenopathy: No inguinal adenopathy noted on the right  or left side.  Neurological: She is oriented to person, place, and time.  Skin: Skin is warm and dry.  Psychiatric: She has a normal mood and affect. Her behavior is normal. Thought content normal.       Assessment:     History of constipation with dietary change due to Celiac diagnosis, resolved with magnesium use  History of Uterine fibroids ? Size change with fullness noted in left adnexa No abdominal pain    Plan:     Discussed finding with uterus and could have noted stool in bowel with uterus making it more noticeable.  Discussed ? Size change in uterus and adnexa. Would recommend PUS to evaluate. Patient agreeable. Patient will be called and scheduled and given insurance information. Warning signs of abdominal pain given.  Rv prn

## 2018-04-01 ENCOUNTER — Telehealth: Payer: Self-pay | Admitting: Certified Nurse Midwife

## 2018-04-01 NOTE — Telephone Encounter (Signed)
Call placed to patient to review and schedule recommended ultrasound. Left voicemail message requesting a return call.

## 2018-04-02 NOTE — Telephone Encounter (Signed)
Second call placed to patient to review benefits and schedule recommended ultrasound. Left voicemail message requesting a return call °

## 2018-04-02 NOTE — Telephone Encounter (Signed)
Patient returned call. Spoke with patient regarding benefit for an ultrasound. Patient understood and agreeable. Patient ready to schedule. Patient scheduled 04/23/18 with Dr Sabra Heck. Earlier appointment dates were offered, but patient declined stating she will be out of town and unavailable until the week of 04/20/18.  Patient aware of appointment date, arrival time and cancellation policy.   Routing to provider for final review. Patient agreeable to disposition. Will close encounter  Routing to Dr Sabra Heck  cc: Melvia Heaps. CNM

## 2018-04-06 ENCOUNTER — Encounter: Payer: Self-pay | Admitting: Neurology

## 2018-04-06 DIAGNOSIS — G4733 Obstructive sleep apnea (adult) (pediatric): Secondary | ICD-10-CM | POA: Diagnosis not present

## 2018-04-06 DIAGNOSIS — Q0700 Arnold-Chiari syndrome without spina bifida or hydrocephalus: Secondary | ICD-10-CM | POA: Diagnosis not present

## 2018-04-06 DIAGNOSIS — I10 Essential (primary) hypertension: Secondary | ICD-10-CM | POA: Diagnosis not present

## 2018-04-06 DIAGNOSIS — E7800 Pure hypercholesterolemia, unspecified: Secondary | ICD-10-CM | POA: Diagnosis not present

## 2018-04-23 ENCOUNTER — Ambulatory Visit: Payer: BLUE CROSS/BLUE SHIELD | Admitting: Obstetrics & Gynecology

## 2018-04-23 ENCOUNTER — Other Ambulatory Visit: Payer: Self-pay | Admitting: Obstetrics & Gynecology

## 2018-04-23 ENCOUNTER — Encounter: Payer: Self-pay | Admitting: Obstetrics & Gynecology

## 2018-04-23 ENCOUNTER — Telehealth: Payer: Self-pay

## 2018-04-23 ENCOUNTER — Other Ambulatory Visit: Payer: Self-pay | Admitting: *Deleted

## 2018-04-23 ENCOUNTER — Ambulatory Visit (INDEPENDENT_AMBULATORY_CARE_PROVIDER_SITE_OTHER): Payer: BLUE CROSS/BLUE SHIELD

## 2018-04-23 VITALS — BP 108/76 | HR 84 | Resp 16 | Ht 63.25 in | Wt 168.0 lb

## 2018-04-23 DIAGNOSIS — N949 Unspecified condition associated with female genital organs and menstrual cycle: Secondary | ICD-10-CM | POA: Diagnosis not present

## 2018-04-23 DIAGNOSIS — N852 Hypertrophy of uterus: Secondary | ICD-10-CM

## 2018-04-23 DIAGNOSIS — D271 Benign neoplasm of left ovary: Secondary | ICD-10-CM

## 2018-04-23 DIAGNOSIS — Z86018 Personal history of other benign neoplasm: Secondary | ICD-10-CM | POA: Diagnosis not present

## 2018-04-23 DIAGNOSIS — D251 Intramural leiomyoma of uterus: Secondary | ICD-10-CM | POA: Diagnosis not present

## 2018-04-23 NOTE — Progress Notes (Signed)
57 y.o. [id number] Married Caucasian female here for pelvic ultrasound due to abnormal physical exam noted by Melvia Heaps, on 03/31/18.  Physical exam showed a 14 weeks sized feeling uterus.  Pt denies pain or vaginal bleeding.  She has noted a change in bowel habits and is having a lot more constipation.    Patient's last menstrual period was 05/28/2013.   Past Medical History:  Diagnosis Date  . Abnormal Pap smear of cervix 1994   cin3  . Depression   . Fibromyalgia   . Heart attack ([place])   . Hypertension    Past Surgical History:  Procedure Laterality Date  . BACK SURGERY  1996  . BOWEL RESECTION  04/2003   ruptured appendix with abscess and small and large bowel resection  . CERVICAL CONE BIOPSY  1993?  . salivary gland removal Right 7/13   Current Outpatient Medications on File Prior to Visit  Medication Sig Dispense Refill  . Acetaminophen (TYLENOL PO) Take by mouth as needed.    . ALPRAZolam (XANAX) 1 MG tablet   0  . aspirin 81 MG tablet Take 81 mg by mouth daily.    . bifidobacterium infantis (ALIGN) capsule Take 4 mg by mouth as needed.    . carisoprodol (SOMA) 350 MG tablet TK 1 T PO BID PRN  0  . carvedilol (COREG) 3.125 MG tablet TAKE 1 TABLET(3.125 MG) BY MOUTH TWICE DAILY    . cyanocobalamin (,VITAMIN B-12,) 1000 MCG/ML injection INJECT 1 ML INTO MUSCLE Q 2 WEEKS UTD  5  . Diazepam (VALIUM PO) Take by mouth as needed. Reported on 02/23/2016    . escitalopram (LEXAPRO) 20 MG tablet 10 mg.     . fexofenadine (ALLEGRA) 60 MG tablet Take 60 mg by mouth daily as needed. For seasonal allergies    . fluticasone (FLONASE) 50 MCG/ACT nasal spray SHAKE LQ AND U 2 SPRAYS IEN D FOR 10 DAYS  1  . hyoscyamine (LEVSIN SL) 0.125 MG SL tablet Take 0.125 mg by mouth as needed.    Marland Kitchen HYZAAR 100-25 MG tablet TK 1 T PO D  2  . metroNIDAZOLE (METROGEL) 0.75 % gel as needed.    . nitroGLYCERIN (NITROSTAT) 0.4 MG SL tablet Place under the tongue as needed.    . Probiotic Product (ALIGN PO)  Take by mouth as needed.     . rosuvastatin (CRESTOR) 20 MG tablet Take 20 mg by mouth daily.     No current facility-administered medications on file prior to visit.    Allergies  Allergen Reactions  . Paroxetine Hcl Other (See Comments)    Hypoglycemia   Ultrasound findings:  UTERUS: 8.1 x 5.3 x 2.8cm with 6.0 x 6.3cm fibroid with calcifications noted EMS: 4.63mm ADNEXA: Left ovary: 11.5 x 8.2 x 7.8cm, smooth walled cystic appearance, avascular with scattered echogenic debris present       Right ovary: 3.3 x 2.1 x 1.3cm CUL DE SAC: no free fluid.  Cyst mass is in cul de sac at this time.  Discussion:  Findings reviewed.  Excision recommended.  Reviewed surgical hx with pt.  In 2004, pt had intermittent RLQ pain for about 3 weeks.  Traveled to [place] during this time and became quite sick while she was there.  Ultimately had a ruptured appendix that required surgical treatment with laparotomy (large midline incision) and partial small and large bowel resections.  She was in the hospital about a week and does not have a lot of memory of this  experience.  We do not have any records of this hospitalization or surgery.  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    Plan:  D/w pt LSO and possible BOS.  She would prefer BSO.  Possibility of significant adhesions from prior surgery present.  She does not really desire having anything done with fibroid as she is having no issues.  (Does have h/o fibroids so this is not a new finding.)  D/w pt referral to gyn/onc for surgery.  She feels more comfortable with this plan.  Personally called for appt for pt today due to some specific appt dates she has over the next few weeks due to summer travel plans.  Ovarian torsion risks reviewed.  Due to location of ovary/cyst in posterior cul de sac today on ultrasound, feel this  is not very likely.  Activities that could increase risk discussed.    Ca-125 obtained today.   ~30 minutes spent with patient >50% of time was in face to face discussion of above.

## 2018-04-23 NOTE — Telephone Encounter (Signed)
Incoming call from Dr Edwinna Areola, she would like to refer a patient here and would like an appt for her, as she is in her office now. I told Dr Sabra Heck that Dr Denman George may like to review case first but Dr Sabra Heck said she'll inbasket Dr Denman George.  Appt made for patient on July 17th at 10:45 am - pt agreeable with appt time/ date (pt preferred after 7-14). Will also inbasket Dr Denman George.

## 2018-04-24 LAB — CA 125: Cancer Antigen (CA) 125: 7.3 U/mL (ref 0.0–38.1)

## 2018-04-25 ENCOUNTER — Encounter: Payer: Self-pay | Admitting: Obstetrics & Gynecology

## 2018-05-08 ENCOUNTER — Other Ambulatory Visit: Payer: Self-pay | Admitting: Internal Medicine

## 2018-05-08 ENCOUNTER — Ambulatory Visit
Admission: RE | Admit: 2018-05-08 | Discharge: 2018-05-08 | Disposition: A | Discharge status: Home / Self Care | Payer: BLUE CROSS/BLUE SHIELD | Source: Ambulatory Visit | Attending: Internal Medicine | Admitting: Internal Medicine

## 2018-05-08 DIAGNOSIS — R05 Cough: Secondary | ICD-10-CM | POA: Diagnosis not present

## 2018-05-08 DIAGNOSIS — J209 Acute bronchitis, unspecified: Secondary | ICD-10-CM | POA: Diagnosis not present

## 2018-05-08 DIAGNOSIS — T753XXA Motion sickness, initial encounter: Secondary | ICD-10-CM | POA: Diagnosis not present

## 2018-05-08 DIAGNOSIS — J40 Bronchitis, not specified as acute or chronic: Secondary | ICD-10-CM

## 2018-05-08 DIAGNOSIS — R110 Nausea: Secondary | ICD-10-CM | POA: Diagnosis not present

## 2018-05-13 ENCOUNTER — Encounter: Payer: Self-pay | Admitting: Gynecologic Oncology

## 2018-05-13 ENCOUNTER — Inpatient Hospital Stay: Payer: BLUE CROSS/BLUE SHIELD | Attending: Gynecologic Oncology | Admitting: Gynecologic Oncology

## 2018-05-13 VITALS — BP 104/72 | HR 79 | Temp 98.3°F | Resp 20 | Ht 64.0 in | Wt 168.6 lb

## 2018-05-13 DIAGNOSIS — N83202 Unspecified ovarian cyst, left side: Secondary | ICD-10-CM | POA: Diagnosis not present

## 2018-05-13 DIAGNOSIS — N949 Unspecified condition associated with female genital organs and menstrual cycle: Secondary | ICD-10-CM

## 2018-05-13 DIAGNOSIS — Z87891 Personal history of nicotine dependence: Secondary | ICD-10-CM | POA: Insufficient documentation

## 2018-05-13 NOTE — H&P (View-Only) (Signed)
Consult Note: Gyn-Onc  Consult was requested by Dr. Sabra Heck for the evaluation of Brandi Rogers 57 y.o. female  CC:  Chief Complaint  Patient presents with  . Adnexal cyst    Assessment/Plan:  Ms. Brandi Rogers  is a 57 y.o.  year old with a left ovarian cyst (12cm) which appears likely to be benign on imaging and is associated with a normal CA 125.   We reviewed the ultrasound images together.  We reviewed why I suspect this is likely a benign mass.  However it is symptomatic with mass-effect and pressure on pelvic structures.  Therefore I think it is prudent to remove the cystic mass, have it evaluated for pathology.  She has a significant history for cardiac disease including an MI in 2016 for which she received a coronary stent.  This places her at increased perioperative cardiac risk and we will discuss this risk with her cardiologist Dr. Mauricio Po, to ensure that no perioperative optimization is necessary.  I feel comfortable performing a surgery with her maintaining her 81 mg of aspirin.   Her other significant risk factors are prior laparotomy for ruptured appendix with abscess formation.  This places her at substantial risk for complex adhesive disease which may be associated with a need for bowel resection during the surgery, and increased risk for bowel injury or reoperation.  I discussed this risk for the patient, and the potential for her having conversion to laparotomy.  I discussed that there is an increased risk for postoperative complications associated with this procedure.  Marine desires proceeding with surgery.  We will perform a robotic assisted lysis of adhesions and unilateral salpingo-oophorectomy with frozen section.  Provided is benign she will retain the contralateral ovary (provided it appears grossly normal).  The reasoning for retaining her contralateral ovary is that with her concurrent cardiovascular disease she is at increased risk for early mortality with  early BSO in the setting of benign disease.  I discussed the potential for conversion to laparotomy, bowel resection, and other surgical risks including  bleeding, infection, damage to internal organs (such as bladder,ureters, bowels), blood clot, reoperation and rehospitalization.   HPI: Brandi Rogers is a 57 year old P3 who is seen in consultation at the request of Dr. Sabra Heck for a large left-sided ovarian cyst.  The patient's reports knowing that she has a fibroid uterus however she began feeling a more noticeable left lower abdominal mass and some discomfort in June 2019.  She was seen and evaluated by Dr. Sabra Heck who performed a transvaginal ultrasound scan on April 25, 2018 which revealed an anteverted enlarged uterus measuring 8.1 x 5.3 x 2.8 cm and including a 6 cm fundal fibroid.  The endometrium was 4.7 mm thick.  The right ovary was grossly normal and measure 3.3 x 2.1 x 1.3 cm.  The left ovary was not seen but instead a large thin-walled cystic structure measuring 11 x 8 x 8 cm was seen.  This was avascular with scattered echogenic debris throughout.  He had an appearance of a serous cystadenoma.  No adnexal masses were identified separate to this.  No free fluid was seen.  Ca1 25 was drawn on April 23, 2018 and was normal at 7.3.  The patient reports that she has had 3 prior vaginal deliveries.  She has no significant family history for uterine, breast, ovarian cancer.  Her surgical history is significant for a prior exploratory laparotomy in [place] for a ruptured appendix with abscess formation and  small and large bowel resection necessary.  This was performed in July 2004.  Postoperatively she stayed in hospital for approximately 7 days with an ileus.  She has had no issues since that time and no additional surgeries.  Her medical history is significant for early onset hypertension.  This is likely the reason for why she developed coronary disease and she experienced a myocardial infarction in  2016.  This was treated with at [facility].  Dr. Tonna Corner is her interventional cardiologist and Dr. Mauricio Po is her cardiologist.  She sees him once a year.  She denies shortness of breath or chest pain.  She is no longer on antiplatelet therapy but takes 81 mg of aspirin daily.  Current Meds:  Outpatient Encounter Medications as of 05/13/2018  Medication Sig  . Acetaminophen (TYLENOL PO) Take by mouth as needed.  . ALPRAZolam (XANAX) 1 MG tablet as needed.   Marland Kitchen aspirin 81 MG tablet Take 81 mg by mouth daily.  . bifidobacterium infantis (ALIGN) capsule Take 4 mg by mouth as needed.  . carisoprodol (SOMA) 350 MG tablet TK 1 T PO BID PRN  . carvedilol (COREG) 3.125 MG tablet TAKE 1 TABLET(3.125 MG) BY MOUTH  DAILY  . cyanocobalamin (,VITAMIN B-12,) 1000 MCG/ML injection INJECT 1 ML INTO MUSCLE Q 2 WEEKS UTD  . Diazepam (VALIUM PO) Take by mouth as needed. Reported on 02/23/2016  . escitalopram (LEXAPRO) 20 MG tablet 10 mg daily.   . fexofenadine (ALLEGRA) 60 MG tablet Take 60 mg by mouth daily as needed. For seasonal allergies  . fluticasone (FLONASE) 50 MCG/ACT nasal spray SHAKE LQ AND U 2 SPRAYS IEN D FOR 10 DAYS  . hyoscyamine (LEVSIN SL) 0.125 MG SL tablet Take 0.125 mg by mouth as needed.  Marland Kitchen HYZAAR 100-25 MG tablet TK 1 T PO D  . metroNIDAZOLE (METROGEL) 0.75 % gel as needed.  . nitroGLYCERIN (NITROSTAT) 0.4 MG SL tablet Place under the tongue as needed.  Marland Kitchen omeprazole (PRILOSEC) 40 MG capsule Take 40 mg by mouth daily.  . rosuvastatin (CRESTOR) 20 MG tablet Take 20 mg by mouth daily.  . [DISCONTINUED] Probiotic Product (ALIGN PO) Take by mouth as needed.    No facility-administered encounter medications on file as of 05/13/2018.     Allergy:  Allergies  Allergen Reactions  . Paroxetine Hcl Other (See Comments)    Hypoglycemia    Social Hx:   Social History   Socioeconomic History  . Marital status: Married    Spouse name: Not on file  . Number of children: Not on file  . Years of  education: Not on file  . Highest education level: Not on file  Occupational History  . Not on file  Social Needs  . Financial resource strain: Not on file  . Food insecurity:    Worry: Not on file    Inability: Not on file  . Transportation needs:    Medical: Not on file    Non-medical: Not on file  Tobacco Use  . Smoking status: Former Research scientist (life sciences)  . Smokeless tobacco: Never Used  Substance and Sexual Activity  . Alcohol use: Yes    Alcohol/week: 4.8 oz    Types: 8 Glasses of wine per week    Comment: ocassional  . Drug use: No  . Sexual activity: Yes    Partners: Male    Comment: husband vasectomy  Lifestyle  . Physical activity:    Days per week: Not on file    Minutes per session:  Not on file  . Stress: Not on file  Relationships  . Social connections:    Talks on phone: Not on file    Gets together: Not on file    Attends religious service: Not on file    Active member of club or organization: Not on file    Attends meetings of clubs or organizations: Not on file    Relationship status: Not on file  . Intimate partner violence:    Fear of current or ex partner: Not on file    Emotionally abused: Not on file    Physically abused: Not on file    Forced sexual activity: Not on file  Other Topics Concern  . Not on file  Social History Narrative  . Not on file    Past Surgical Hx:  Past Surgical History:  Procedure Laterality Date  . APPENDECTOMY    . BACK SURGERY  1996  . BOWEL RESECTION  04/2003   ruptured appendix with abscess and small and large bowel resection  . CERVICAL CONE BIOPSY  1993?  . salivary gland removal Right 7/13    Past Medical Hx:  Past Medical History:  Diagnosis Date  . Abnormal Pap smear of cervix 1994   cin3  . Depression   . Fibromyalgia   . Heart attack ([place])   . Hypertension     Past Gynecological History:  SVD x 3 Patient's last menstrual period was 05/28/2013.  Family Hx:  Family History  Problem Relation Age of Onset   . Hypertension Mother   . Stroke Mother   . Cancer Father        lung  . Hypertension Sister   . Hypertension Brother   . Multiple sclerosis Brother   . Hypertension Maternal Grandmother   . Colon cancer Maternal Aunt     Review of Systems:  Constitutional  Feels well,    ENT Normal appearing ears and nares bilaterally Skin/Breast  No rash, sores, jaundice, itching, dryness Cardiovascular  No chest pain, shortness of breath, or edema  Pulmonary  No cough or wheeze.  Gastro Intestinal  No nausea, vomitting, or diarrhoea. No bright red blood per rectum, no abdominal pain, change in bowel movement, or constipation.  Genito Urinary  No frequency, urgency, dysuria, + pelvic pressure Musculo Skeletal  No myalgia, arthralgia, joint swelling or pain  Neurologic  No weakness, numbness, change in gait,  Psychology  No depression, anxiety, insomnia.   Vitals:  Blood pressure 104/72, pulse 79, temperature 98.3 F (36.8 C), temperature source Oral, resp. rate 20, height 5\' 4"  (1.626 m), weight 168 lb 9.6 oz (76.5 kg), last menstrual period 05/28/2013, SpO2 99 %.  Physical Exam: WD in NAD Neck  Supple NROM, without any enlargements.  Lymph Node Survey No cervical supraclavicular or inguinal adenopathy Cardiovascular  Pulse normal rate, regularity and rhythm. S1 and S2 normal.  Lungs  Clear to auscultation bilateraly, without wheezes/crackles/rhonchi. Good air movement.  Skin  No rash/lesions/breakdown  Psychiatry  Alert and oriented to person, place, and time  Abdomen  Normoactive bowel sounds, abdomen soft, non-tender and obese without evidence of hernia.  Back No CVA tenderness Genito Urinary  Vulva/vagina: Normal external female genitalia.  No lesions. No discharge or bleeding.  Bladder/urethra:  No lesions or masses, well supported bladder  Vagina: normal  Cervix: Normal appearing, no lesions.  Uterus: bulky, displaced anteriorally, mobile, no parametrial  involvement or nodularity.  Adnexa: smooth cystic fullness in cul de sac posteriorally.  Rectal  deferred Extremities  No bilateral cyanosis, clubbing or edema.   Thereasa Solo, MD  05/13/2018, 4:49 PM

## 2018-05-13 NOTE — Progress Notes (Signed)
Consult Note: Gyn-Onc  Consult was requested by Dr. Sabra Heck for the evaluation of Brandi Rogers 57 y.o. female  CC:  Chief Complaint  Patient presents with  . Adnexal cyst    Assessment/Plan:  Brandi Rogers  is a 57 y.o.  year old with a left ovarian cyst (12cm) which appears likely to be benign on imaging and is associated with a normal CA 125.   We reviewed the ultrasound images together.  We reviewed why I suspect this is likely a benign mass.  However it is symptomatic with mass-effect and pressure on pelvic structures.  Therefore I think it is prudent to remove the cystic mass, have it evaluated for pathology.  She has a significant history for cardiac disease including an MI in 2016 for which she received a coronary stent.  This places her at increased perioperative cardiac risk and we will discuss this risk with her cardiologist Dr. Mauricio Po, to ensure that no perioperative optimization is necessary.  I feel comfortable performing a surgery with her maintaining her 81 mg of aspirin.   Her other significant risk factors are prior laparotomy for ruptured appendix with abscess formation.  This places her at substantial risk for complex adhesive disease which may be associated with a need for bowel resection during the surgery, and increased risk for bowel injury or reoperation.  I discussed this risk for the patient, and the potential for her having conversion to laparotomy.  I discussed that there is an increased risk for postoperative complications associated with this procedure.  Brandi Rogers desires proceeding with surgery.  We will perform a robotic assisted lysis of adhesions and unilateral salpingo-oophorectomy with frozen section.  Provided is benign she will retain the contralateral ovary (provided it appears grossly normal).  The reasoning for retaining her contralateral ovary is that with her concurrent cardiovascular disease she is at increased risk for early mortality with  early BSO in the setting of benign disease.  I discussed the potential for conversion to laparotomy, bowel resection, and other surgical risks including  bleeding, infection, damage to internal organs (such as bladder,ureters, bowels), blood clot, reoperation and rehospitalization.   HPI: Brandi Rogers is a 57 year old P3 who is seen in consultation at the request of Dr. Sabra Heck for a large left-sided ovarian cyst.  The patient's reports knowing that she has a fibroid uterus however she began feeling a more noticeable left lower abdominal mass and some discomfort in June 2019.  She was seen and evaluated by Dr. Sabra Heck who performed a transvaginal ultrasound scan on April 25, 2018 which revealed an anteverted enlarged uterus measuring 8.1 x 5.3 x 2.8 cm and including a 6 cm fundal fibroid.  The endometrium was 4.7 mm thick.  The right ovary was grossly normal and measure 3.3 x 2.1 x 1.3 cm.  The left ovary was not seen but instead a large thin-walled cystic structure measuring 11 x 8 x 8 cm was seen.  This was avascular with scattered echogenic debris throughout.  He had an appearance of a serous cystadenoma.  No adnexal masses were identified separate to this.  No free fluid was seen.  Ca1 25 was drawn on April 23, 2018 and was normal at 7.3.  The patient reports that she has had 3 prior vaginal deliveries.  She has no significant family history for uterine, breast, ovarian cancer.  Her surgical history is significant for a prior exploratory laparotomy in [place] for a ruptured appendix with abscess formation and  small and large bowel resection necessary.  This was performed in July 2004.  Postoperatively she stayed in hospital for approximately 7 days with an ileus.  She has had no issues since that time and no additional surgeries.  Her medical history is significant for early onset hypertension.  This is likely the reason for why she developed coronary disease and she experienced a myocardial infarction in  2016.  This was treated with at [facility].  Dr. Tonna Corner is her interventional cardiologist and Dr. Mauricio Po is her cardiologist.  She sees him once a year.  She denies shortness of breath or chest pain.  She is no longer on antiplatelet therapy but takes 81 mg of aspirin daily.  Current Meds:  Outpatient Encounter Medications as of 05/13/2018  Medication Sig  . Acetaminophen (TYLENOL PO) Take by mouth as needed.  . ALPRAZolam (XANAX) 1 MG tablet as needed.   Marland Kitchen aspirin 81 MG tablet Take 81 mg by mouth daily.  . bifidobacterium infantis (ALIGN) capsule Take 4 mg by mouth as needed.  . carisoprodol (SOMA) 350 MG tablet TK 1 T PO BID PRN  . carvedilol (COREG) 3.125 MG tablet TAKE 1 TABLET(3.125 MG) BY MOUTH  DAILY  . cyanocobalamin (,VITAMIN B-12,) 1000 MCG/ML injection INJECT 1 ML INTO MUSCLE Q 2 WEEKS UTD  . Diazepam (VALIUM PO) Take by mouth as needed. Reported on 02/23/2016  . escitalopram (LEXAPRO) 20 MG tablet 10 mg daily.   . fexofenadine (ALLEGRA) 60 MG tablet Take 60 mg by mouth daily as needed. For seasonal allergies  . fluticasone (FLONASE) 50 MCG/ACT nasal spray SHAKE LQ AND U 2 SPRAYS IEN D FOR 10 DAYS  . hyoscyamine (LEVSIN SL) 0.125 MG SL tablet Take 0.125 mg by mouth as needed.  Marland Kitchen HYZAAR 100-25 MG tablet TK 1 T PO D  . metroNIDAZOLE (METROGEL) 0.75 % gel as needed.  . nitroGLYCERIN (NITROSTAT) 0.4 MG SL tablet Place under the tongue as needed.  Marland Kitchen omeprazole (PRILOSEC) 40 MG capsule Take 40 mg by mouth daily.  . rosuvastatin (CRESTOR) 20 MG tablet Take 20 mg by mouth daily.  . [DISCONTINUED] Probiotic Product (ALIGN PO) Take by mouth as needed.    No facility-administered encounter medications on file as of 05/13/2018.     Allergy:  Allergies  Allergen Reactions  . Paroxetine Hcl Other (See Comments)    Hypoglycemia    Social Hx:   Social History   Socioeconomic History  . Marital status: Married    Spouse name: Not on file  . Number of children: Not on file  . Years of  education: Not on file  . Highest education level: Not on file  Occupational History  . Not on file  Social Needs  . Financial resource strain: Not on file  . Food insecurity:    Worry: Not on file    Inability: Not on file  . Transportation needs:    Medical: Not on file    Non-medical: Not on file  Tobacco Use  . Smoking status: Former Research scientist (life sciences)  . Smokeless tobacco: Never Used  Substance and Sexual Activity  . Alcohol use: Yes    Alcohol/week: 4.8 oz    Types: 8 Glasses of wine per week    Comment: ocassional  . Drug use: No  . Sexual activity: Yes    Partners: Male    Comment: husband vasectomy  Lifestyle  . Physical activity:    Days per week: Not on file    Minutes per session:  Not on file  . Stress: Not on file  Relationships  . Social connections:    Talks on phone: Not on file    Gets together: Not on file    Attends religious service: Not on file    Active member of club or organization: Not on file    Attends meetings of clubs or organizations: Not on file    Relationship status: Not on file  . Intimate partner violence:    Fear of current or ex partner: Not on file    Emotionally abused: Not on file    Physically abused: Not on file    Forced sexual activity: Not on file  Other Topics Concern  . Not on file  Social History Narrative  . Not on file    Past Surgical Hx:  Past Surgical History:  Procedure Laterality Date  . APPENDECTOMY    . BACK SURGERY  1996  . BOWEL RESECTION  04/2003   ruptured appendix with abscess and small and large bowel resection  . CERVICAL CONE BIOPSY  1993?  . salivary gland removal Right 7/13    Past Medical Hx:  Past Medical History:  Diagnosis Date  . Abnormal Pap smear of cervix 1994   cin3  . Depression   . Fibromyalgia   . Heart attack ([place])   . Hypertension     Past Gynecological History:  SVD x 3 Patient's last menstrual period was 05/28/2013.  Family Hx:  Family History  Problem Relation Age of Onset   . Hypertension Mother   . Stroke Mother   . Cancer Father        lung  . Hypertension Sister   . Hypertension Brother   . Multiple sclerosis Brother   . Hypertension Maternal Grandmother   . Colon cancer Maternal Aunt     Review of Systems:  Constitutional  Feels well,    ENT Normal appearing ears and nares bilaterally Skin/Breast  No rash, sores, jaundice, itching, dryness Cardiovascular  No chest pain, shortness of breath, or edema  Pulmonary  No cough or wheeze.  Gastro Intestinal  No nausea, vomitting, or diarrhoea. No bright red blood per rectum, no abdominal pain, change in bowel movement, or constipation.  Genito Urinary  No frequency, urgency, dysuria, + pelvic pressure Musculo Skeletal  No myalgia, arthralgia, joint swelling or pain  Neurologic  No weakness, numbness, change in gait,  Psychology  No depression, anxiety, insomnia.   Vitals:  Blood pressure 104/72, pulse 79, temperature 98.3 F (36.8 C), temperature source Oral, resp. rate 20, height 5\' 4"  (1.626 m), weight 168 lb 9.6 oz (76.5 kg), last menstrual period 05/28/2013, SpO2 99 %.  Physical Exam: WD in NAD Neck  Supple NROM, without any enlargements.  Lymph Node Survey No cervical supraclavicular or inguinal adenopathy Cardiovascular  Pulse normal rate, regularity and rhythm. S1 and S2 normal.  Lungs  Clear to auscultation bilateraly, without wheezes/crackles/rhonchi. Good air movement.  Skin  No rash/lesions/breakdown  Psychiatry  Alert and oriented to person, place, and time  Abdomen  Normoactive bowel sounds, abdomen soft, non-tender and obese without evidence of hernia.  Back No CVA tenderness Genito Urinary  Vulva/vagina: Normal external female genitalia.  No lesions. No discharge or bleeding.  Bladder/urethra:  No lesions or masses, well supported bladder  Vagina: normal  Cervix: Normal appearing, no lesions.  Uterus: bulky, displaced anteriorally, mobile, no parametrial  involvement or nodularity.  Adnexa: smooth cystic fullness in cul de sac posteriorally.  Rectal  deferred Extremities  No bilateral cyanosis, clubbing or edema.   Thereasa Solo, MD  05/13/2018, 4:49 PM

## 2018-05-13 NOTE — Patient Instructions (Signed)
Preparing for your Surgery  Plan for surgery on June 02, 2018 with Dr. Everitt Rogers at [facility] will be scheduled for a robotic assisted unilateral salpingo-oophorectomy, lysis of adhesions, possible laparotomy, possible staging.   You can stay on your aspirin.  We will need to obtain cardiac clearance from your cardiologist.   Pre-operative Testing -You will receive a phone call from presurgical testing at [facility] to arrange for a pre-operative testing appointment before your surgery.  This appointment normally occurs one to two weeks before your scheduled surgery.   -Bring your insurance card, copy of an advanced directive if applicable, medication list  -At that visit, you will be asked to sign a consent for a possible blood transfusion in case a transfusion becomes necessary during surgery.  The need for a blood transfusion is rare but having consent is a necessary part of your care.    Day Before Surgery at [facility] will be asked to take in a light diet the day before surgery.  Avoid carbonated beverages.  You will be advised to have nothing to eat or drink after midnight the evening before.    Eat a light diet the day before surgery.  Examples including soups, broths, toast, yogurt, mashed potatoes.  Things to avoid include carbonated beverages (fizzy beverages), raw fruits and raw vegetables, or beans.   If your bowels are filled with gas, your surgeon will have difficulty visualizing your pelvic organs which increases your surgical risks.  Your role in recovery Your role is to become active as soon as directed by your doctor, while still giving yourself time to heal.  Rest when you feel tired. You will be asked to do the following in order to speed your recovery:  - Cough and breathe deeply. This helps toclear and expand your lungs and can prevent pneumonia. You may be given a spirometer to practice deep breathing. A staff member will show  you how to use the spirometer. - Do mild physical activity. Walking or moving your legs help your circulation and body functions return to normal. A staff member will help you when you try to walk and will provide you with simple exercises. Do not try to get up or walk alone the first time. - Actively manage your pain. Managing your pain lets you move in comfort. We will ask you to rate your pain on a scale of zero to 10. It is your responsibility to tell your doctor or nurse where and how much you hurt so your pain can be treated.  Special Considerations -If you are diabetic, you may be placed on insulin after surgery to have closer control over your blood sugars to promote healing and recovery.  This does not mean that you will be discharged on insulin.  If applicable, your oral antidiabetics will be resumed when you are tolerating a solid diet.  -Your final pathology results from surgery should be available by the Friday after surgery and the results will be relayed to you when available.  -Dr. Lahoma Rogers is the Surgeon that assists your GYN Oncologist with surgery.  The next day after your surgery you will either see your GYN Oncologist or Dr. Lahoma Rogers.   Blood Transfusion Information WHAT IS A BLOOD TRANSFUSION? A transfusion is the replacement of blood or some of its parts. Blood is made up of multiple cells which provide different functions.  Red blood cells carry oxygen and are used for blood loss replacement.  White blood cells fight against infection.  Platelets control bleeding.  Plasma helps clot blood.  Other blood products are available for specialized needs, such as hemophilia or other clotting disorders. BEFORE THE TRANSFUSION  Who gives blood for transfusions?   You may be able to donate blood to be used at a later date on yourself (autologous donation).  Relatives can be asked to donate blood. This is generally not any safer than if you have received  blood from a stranger. The same precautions are taken to ensure safety when a relative's blood is donated.  Healthy volunteers who are fully evaluated to make sure their blood is safe. This is blood bank blood. Transfusion therapy is the safest it has ever been in the practice of medicine. Before blood is taken from a donor, a complete history is taken to make sure that person has no history of diseases nor engages in risky social behavior (examples are intravenous drug use or sexual activity with multiple partners). The donor's travel history is screened to minimize risk of transmitting infections, such as malaria. The donated blood is tested for signs of infectious diseases, such as HIV and hepatitis. The blood is then tested to be sure it is compatible with you in order to minimize the chance of a transfusion reaction. If you or a relative donates blood, this is often done in anticipation of surgery and is not appropriate for emergency situations. It takes many days to process the donated blood. RISKS AND COMPLICATIONS Although transfusion therapy is very safe and saves many lives, the main dangers of transfusion include:   Getting an infectious disease.  Developing a transfusion reaction. This is an allergic reaction to something in the blood you were given. Every precaution is taken to prevent this. The decision to have a blood transfusion has been considered carefully by your caregiver before blood is given. Blood is not given unless the benefits outweigh the risks.

## 2018-05-14 ENCOUNTER — Telehealth: Payer: Self-pay | Admitting: Oncology

## 2018-05-14 NOTE — Telephone Encounter (Signed)
Left a message with Dr. Shearon Stalls nurse asking for cardiac clearance for surgery on 06/02/18.

## 2018-05-14 NOTE — Telephone Encounter (Signed)
Brandi Rogers, Dr. Shearon Stalls nurse called back and said he is on vacation until Monday.  She said he usually requires a stress echo for patients with stents.  Asked if a stress echo could be scheduled now and she said she would need an order from Dr. Mauricio Po.  She has sent him a message and will call back as soon as he responds.

## 2018-05-15 NOTE — Telephone Encounter (Signed)
Nunzio Cory called back and said patient has been cleared for surgery per Dr. Mauricio Po.  She will fax a letter to [place] today.

## 2018-05-18 ENCOUNTER — Ambulatory Visit: Payer: Self-pay | Admitting: Gynecologic Oncology

## 2018-05-21 NOTE — Patient Instructions (Addendum)
Brandi Rogers  05/21/2018   Your procedure is scheduled on: 06-02-18   Report to [facility] Main  Entrance    Report to Admitting at 5:30 AM   Eat a light diet the day before surgery.  Examples including soups, broths, toast, yogurt, mashed potatoes.  Things to avoid include carbonated beverages (fizzy beverages), raw fruits and raw vegetables, or beans.   If your bowels are filled with gas, your surgeon will have difficulty visualizing your pelvic organs which increases your surgical risks.    Call this number if you have problems the morning of surgery [phone]   Remember: NO SOLID FOOD AFTER MIDNIGHT THE NIGHT PRIOR TO SURGERY. NOTHING BY MOUTH EXCEPT CLEAR LIQUIDS UNTIL 3 HOURS PRIOR TO [place] SURGERY. PLEASE FINISH ENSURE DRINK PER SURGEON ORDER 3 HOURS PRIOR TO SCHEDULED SURGERY TIME WHICH NEEDS TO BE COMPLETED AT 4:30 AM.      CLEAR LIQUID DIET   Foods Allowed                                                                     Foods Excluded  Coffee and tea, regular and decaf                             liquids that you cannot  Plain Jell-O in any flavor                                             see through such as: Fruit ices (not with fruit pulp)                                     milk, soups, orange juice  Iced Popsicles                                    All solid food Carbonated beverages, regular and diet                                    Cranberry, grape and apple juices Sports drinks like Gatorade Lightly seasoned clear broth or consume(fat free) Sugar, honey syrup  Sample Menu Breakfast                                Lunch                                     Supper Cranberry juice                    Beef broth  Chicken broth Jell-O                                     Grape juice                           Apple juice Coffee or tea                        Jell-O                                       Popsicle                                                Coffee or tea                        Coffee or tea  _____________________________________________________________________    Take these medicines the morning of surgery with A SIP OF WATER: Escitalopram (Lexapro), Omeprazole (Prilosec), Rosuvastatin (Crestor). You may bring and use your nasal spray as needed.                                You may not have any metal on your body including hair pins and              piercings  Do not wear jewelry, make-up, lotions, powders or perfumes, deodorant             Do not wear nail polish.  Do not shave  48 hours prior to surgery.                 Do not bring valuables to the hospital. [place].  Contacts, dentures or bridgework may not be worn into surgery.  Leave suitcase in the car. After surgery it may be brought to your room.     Special Instructions: Please bring your mask and tubing for the CPAP machine              Please read over the following fact sheets you were given: _____________________________________________________________________             [facility] - Preparing for Surgery Before surgery, you can play an important role.  Because skin is not sterile, your skin needs to be as free of germs as possible.  You can reduce the number of germs on your skin by washing with CHG (chlorahexidine gluconate) soap before surgery.  CHG is an antiseptic cleaner which kills germs and bonds with the skin to continue killing germs even after washing. Please DO NOT use if you have an allergy to CHG or antibacterial soaps.  If your skin becomes reddened/irritated stop using the CHG and inform your nurse when you arrive at Short Stay. Do not shave (including legs and underarms) for at least 48 hours prior to the first CHG shower.  You may shave your face/neck. Please follow these instructions carefully:  1.  Shower with CHG Soap the  night before surgery and the  morning of Surgery.  2.  If you choose to wash your hair, wash your hair first as usual with your  normal  shampoo.  3.  After you shampoo, rinse your hair and body thoroughly to remove the  shampoo.                           4.  Use CHG as you would any other liquid soap.  You can apply chg directly  to the skin and wash                       Gently with a scrungie or clean washcloth.  5.  Apply the CHG Soap to your body ONLY FROM THE NECK DOWN.   Do not use on face/ open                           Wound or open sores. Avoid contact with eyes, ears mouth and genitals (private parts).                       Wash face,  Genitals (private parts) with your normal soap.             6.  Wash thoroughly, paying special attention to the area where your surgery  will be performed.  7.  Thoroughly rinse your body with warm water from the neck down.  8.  DO NOT shower/wash with your normal soap after using and rinsing off  the CHG Soap.                9.  Pat yourself dry with a clean towel.            10.  Wear clean pajamas.            11.  Place clean sheets on your bed the night of your first shower and do not  sleep with pets. Day of Surgery : Do not apply any lotions/deodorants the morning of surgery.  Please wear clean clothes to the hospital/surgery center.  FAILURE TO FOLLOW THESE INSTRUCTIONS MAY RESULT IN THE CANCELLATION OF YOUR SURGERY PATIENT SIGNATURE_________________________________  NURSE SIGNATURE__________________________________  ________________________________________________________________________   Adam Phenix  An incentive spirometer is a tool that can help keep your lungs clear and active. This tool measures how well you are filling your lungs with each breath. Taking long deep breaths may help reverse or decrease the chance of developing breathing (pulmonary) problems (especially infection) following:  A long period of time when you  are unable to move or be active. BEFORE THE PROCEDURE   If the spirometer includes an indicator to show your best effort, your nurse or respiratory therapist will set it to a desired goal.  If possible, sit up straight or lean slightly forward. Try not to slouch.  Hold the incentive spirometer in an upright position. INSTRUCTIONS FOR USE  1. Sit on the edge of your bed if possible, or sit up as far as you can in bed or on a chair. 2. Hold the incentive spirometer in an upright position. 3. Breathe out normally. 4. Place the mouthpiece in your mouth and seal your lips tightly around it. 5. Breathe in slowly and as deeply as possible, raising the piston or the ball toward the top of the column. 6.  Hold your breath for 3-5 seconds or for as long as possible. Allow the piston or ball to fall to the bottom of the column. 7. Remove the mouthpiece from your mouth and breathe out normally. 8. Rest for a few seconds and repeat Steps 1 through 7 at least 10 times every 1-2 hours when you are awake. Take your time and take a few normal breaths between deep breaths. 9. The spirometer may include an indicator to show your best effort. Use the indicator as a goal to work toward during each repetition. 10. After each set of 10 deep breaths, practice coughing to be sure your lungs are clear. If you have an incision (the cut made at the time of surgery), support your incision when coughing by placing a pillow or rolled up towels firmly against it. Once you are able to get out of bed, walk around indoors and cough well. You may stop using the incentive spirometer when instructed by your caregiver.  RISKS AND COMPLICATIONS  Take your time so you do not get dizzy or light-headed.  If you are in pain, you may need to take or ask for pain medication before doing incentive spirometry. It is harder to take a deep breath if you are having pain. AFTER USE  Rest and breathe slowly and easily.  It can be helpful to  keep track of a log of your progress. Your caregiver can provide you with a simple table to help with this. If you are using the spirometer at home, follow these instructions: [place] IF:   You are having difficultly using the spirometer.  You have trouble using the spirometer as often as instructed.  Your pain medication is not giving enough relief while using the spirometer.  You develop fever of 100.5 F (38.1 C) or higher. SEEK IMMEDIATE MEDICAL CARE IF:   You cough up bloody sputum that had not been present before.  You develop fever of 102 F (38.9 C) or greater.  You develop worsening pain at or near the incision site. MAKE SURE YOU:   Understand these instructions.  Will watch your condition.  Will get help right away if you are not doing well or get worse. Document Released: 02/24/2007 Document Revised: 01/06/2012 Document Reviewed: 04/27/2007 ExitCare Patient Information 2014 ExitCare, [place].   ________________________________________________________________________  WHAT IS A BLOOD TRANSFUSION? Blood Transfusion Information  A transfusion is the replacement of blood or some of its parts. Blood is made up of multiple cells which provide different functions.  Red blood cells carry oxygen and are used for blood loss replacement.  White blood cells fight against infection.  Platelets control bleeding.  Plasma helps clot blood.  Other blood products are available for specialized needs, such as hemophilia or other clotting disorders. BEFORE THE TRANSFUSION  Who gives blood for transfusions?   Healthy volunteers who are fully evaluated to make sure their blood is safe. This is blood bank blood. Transfusion therapy is the safest it has ever been in the practice of medicine. Before blood is taken from a donor, a complete history is taken to make sure that person has no history of diseases nor engages in risky social behavior (examples are intravenous drug  use or sexual activity with multiple partners). The donor's travel history is screened to minimize risk of transmitting infections, such as malaria. The donated blood is tested for signs of infectious diseases, such as HIV and hepatitis. The blood is then tested to be sure it is  compatible with you in order to minimize the chance of a transfusion reaction. If you or a relative donates blood, this is often done in anticipation of surgery and is not appropriate for emergency situations. It takes many days to process the donated blood. RISKS AND COMPLICATIONS Although transfusion therapy is very safe and saves many lives, the main dangers of transfusion include:   Getting an infectious disease.  Developing a transfusion reaction. This is an allergic reaction to something in the blood you were given. Every precaution is taken to prevent this. The decision to have a blood transfusion has been considered carefully by your caregiver before blood is given. Blood is not given unless the benefits outweigh the risks. AFTER THE TRANSFUSION  Right after receiving a blood transfusion, you will usually feel much better and more energetic. This is especially true if your red blood cells have gotten low (anemic). The transfusion raises the level of the red blood cells which carry oxygen, and this usually causes an energy increase.  The nurse administering the transfusion will monitor you carefully for complications. HOME CARE INSTRUCTIONS  No special instructions are needed after a transfusion. You may find your energy is better. Speak with your caregiver about any limitations on activity for underlying diseases you may have. SEEK MEDICAL CARE IF:   Your condition is not improving after your transfusion.  You develop redness or irritation at the intravenous (IV) site. SEEK IMMEDIATE MEDICAL CARE IF:  Any of the following symptoms occur over the next 12 hours:  Shaking chills.  You have a temperature by mouth  above 102 F (38.9 C), not controlled by medicine.  Chest, back, or muscle pain.  People around you feel you are not acting correctly or are confused.  Shortness of breath or difficulty breathing.  Dizziness and fainting.  You get a rash or develop hives.  You have a decrease in urine output.  Your urine turns a dark color or changes to pink, red, or brown. Any of the following symptoms occur over the next 10 days:  You have a temperature by mouth above 102 F (38.9 C), not controlled by medicine.  Shortness of breath.  Weakness after normal activity.  The white part of the eye turns yellow (jaundice).  You have a decrease in the amount of urine or are urinating less often.  Your urine turns a dark color or changes to pink, red, or brown. Document Released: 10/11/2000 Document Revised: 01/06/2012 Document Reviewed: 05/30/2008 [facility] Patient Information 2014 [place], [place].  _______________________________________________________________________

## 2018-05-22 ENCOUNTER — Encounter (HOSPITAL_COMMUNITY): Payer: Self-pay

## 2018-05-22 ENCOUNTER — Other Ambulatory Visit: Payer: Self-pay

## 2018-05-22 ENCOUNTER — Encounter (HOSPITAL_COMMUNITY)
Admission: RE | Admit: 2018-05-22 | Discharge: 2018-05-22 | Disposition: A | Discharge status: Home / Self Care | Payer: BLUE CROSS/BLUE SHIELD | Source: Ambulatory Visit | Attending: Gynecologic Oncology | Admitting: Gynecologic Oncology

## 2018-05-22 DIAGNOSIS — N83202 Unspecified ovarian cyst, left side: Secondary | ICD-10-CM | POA: Insufficient documentation

## 2018-05-22 DIAGNOSIS — Z0183 Encounter for blood typing: Secondary | ICD-10-CM | POA: Insufficient documentation

## 2018-05-22 DIAGNOSIS — I10 Essential (primary) hypertension: Secondary | ICD-10-CM | POA: Insufficient documentation

## 2018-05-22 DIAGNOSIS — Z01818 Encounter for other preprocedural examination: Secondary | ICD-10-CM | POA: Insufficient documentation

## 2018-05-22 DIAGNOSIS — Z01812 Encounter for preprocedural laboratory examination: Secondary | ICD-10-CM | POA: Insufficient documentation

## 2018-05-22 DIAGNOSIS — R9431 Abnormal electrocardiogram [ECG] [EKG]: Secondary | ICD-10-CM | POA: Diagnosis not present

## 2018-05-22 HISTORY — DX: Adverse effect of unspecified anesthetic, initial encounter: T41.45XA

## 2018-05-22 HISTORY — DX: Nausea with vomiting, unspecified: R11.2

## 2018-05-22 HISTORY — DX: Other complications of anesthesia, initial encounter: T88.59XA

## 2018-05-22 HISTORY — DX: Other specified postprocedural states: Z98.890

## 2018-05-22 LAB — COMPREHENSIVE METABOLIC PANEL
ALBUMIN: 3.9 g/dL (ref 3.5–5.0)
ALT: 40 U/L (ref 0–44)
ANION GAP: 8 (ref 5–15)
AST: 24 U/L (ref 15–41)
Alkaline Phosphatase: 61 U/L (ref 38–126)
BUN: 17 mg/dL (ref 6–20)
CALCIUM: 9.4 mg/dL (ref 8.9–10.3)
CHLORIDE: 104 mmol/L (ref 98–111)
CO2: 29 mmol/L (ref 22–32)
Creatinine, Ser: 0.72 mg/dL (ref 0.44–1.00)
GFR calc non Af Amer: >60 mL/min (ref >60)
GLUCOSE: 115 mg/dL — AB (ref 70–99)
POTASSIUM: 3.6 mmol/L (ref 3.5–5.1)
SODIUM: 141 mmol/L (ref 135–145)
Total Bilirubin: 0.8 mg/dL (ref 0.3–1.2)
Total Protein: 7.1 g/dL (ref 6.5–8.1)

## 2018-05-22 LAB — URINALYSIS, ROUTINE W REFLEX MICROSCOPIC
BILIRUBIN URINE: NEGATIVE
Glucose, UA: NEGATIVE mg/dL
Hgb urine dipstick: NEGATIVE
KETONES UR: NEGATIVE mg/dL
Leukocytes, UA: NEGATIVE
NITRITE: NEGATIVE
PH: 5 (ref 5.0–8.0)
PROTEIN: NEGATIVE mg/dL
Specific Gravity, Urine: 1.02 (ref 1.005–1.030)

## 2018-05-22 LAB — CBC
HEMATOCRIT: 42.6 % (ref 36.0–46.0)
HEMOGLOBIN: 14.2 g/dL (ref 12.0–15.0)
MCH: 29.8 pg (ref 26.0–34.0)
MCHC: 33.3 g/dL (ref 30.0–36.0)
MCV: 89.5 fL (ref 78.0–100.0)
Platelets: 311 10*3/uL (ref 150–400)
RBC: 4.76 MIL/uL (ref 3.87–5.11)
RDW: 13.4 % (ref 11.5–15.5)
WBC: 5.6 10*3/uL (ref 4.0–10.5)

## 2018-05-22 LAB — TYPE AND SCREEN
ABO/RH(D): O POS
ANTIBODY SCREEN: NEGATIVE

## 2018-05-22 LAB — ABO/RH: ABO/RH(D): O POS

## 2018-05-22 NOTE — Progress Notes (Signed)
PAT chart given to [facility], Therapist, sports. Awaiting final EKG and T&S

## 2018-06-02 ENCOUNTER — Ambulatory Visit (HOSPITAL_COMMUNITY): Payer: BLUE CROSS/BLUE SHIELD | Admitting: Certified Registered Nurse Anesthetist

## 2018-06-02 ENCOUNTER — Ambulatory Visit (HOSPITAL_COMMUNITY)
Admission: RE | Admit: 2018-06-02 | Discharge: 2018-06-02 | Disposition: A | Discharge status: Home / Self Care | Payer: BLUE CROSS/BLUE SHIELD | Source: Ambulatory Visit | Attending: Gynecologic Oncology | Admitting: Gynecologic Oncology

## 2018-06-02 ENCOUNTER — Other Ambulatory Visit: Payer: Self-pay

## 2018-06-02 ENCOUNTER — Encounter (HOSPITAL_COMMUNITY): Payer: Self-pay | Admitting: *Deleted

## 2018-06-02 ENCOUNTER — Encounter (HOSPITAL_COMMUNITY)
Admission: RE | Disposition: A | Discharge status: Home / Self Care | Payer: Self-pay | Source: Ambulatory Visit | Attending: Gynecologic Oncology

## 2018-06-02 DIAGNOSIS — Z87891 Personal history of nicotine dependence: Secondary | ICD-10-CM | POA: Insufficient documentation

## 2018-06-02 DIAGNOSIS — I10 Essential (primary) hypertension: Secondary | ICD-10-CM | POA: Insufficient documentation

## 2018-06-02 DIAGNOSIS — Z955 Presence of coronary angioplasty implant and graft: Secondary | ICD-10-CM | POA: Insufficient documentation

## 2018-06-02 DIAGNOSIS — N83292 Other ovarian cyst, left side: Secondary | ICD-10-CM | POA: Diagnosis not present

## 2018-06-02 DIAGNOSIS — N83202 Unspecified ovarian cyst, left side: Secondary | ICD-10-CM | POA: Diagnosis not present

## 2018-06-02 DIAGNOSIS — D259 Leiomyoma of uterus, unspecified: Secondary | ICD-10-CM | POA: Diagnosis not present

## 2018-06-02 DIAGNOSIS — Z888 Allergy status to other drugs, medicaments and biological substances status: Secondary | ICD-10-CM | POA: Insufficient documentation

## 2018-06-02 DIAGNOSIS — R8569 Abnormal cytological findings in specimens from other digestive organs and abdominal cavity: Secondary | ICD-10-CM | POA: Diagnosis not present

## 2018-06-02 DIAGNOSIS — M797 Fibromyalgia: Secondary | ICD-10-CM | POA: Insufficient documentation

## 2018-06-02 DIAGNOSIS — I2510 Atherosclerotic heart disease of native coronary artery without angina pectoris: Secondary | ICD-10-CM | POA: Diagnosis not present

## 2018-06-02 DIAGNOSIS — Z7982 Long term (current) use of aspirin: Secondary | ICD-10-CM | POA: Diagnosis not present

## 2018-06-02 DIAGNOSIS — K660 Peritoneal adhesions (postprocedural) (postinfection): Secondary | ICD-10-CM

## 2018-06-02 DIAGNOSIS — D271 Benign neoplasm of left ovary: Secondary | ICD-10-CM | POA: Diagnosis not present

## 2018-06-02 DIAGNOSIS — K219 Gastro-esophageal reflux disease without esophagitis: Secondary | ICD-10-CM | POA: Diagnosis not present

## 2018-06-02 DIAGNOSIS — F329 Major depressive disorder, single episode, unspecified: Secondary | ICD-10-CM | POA: Diagnosis not present

## 2018-06-02 DIAGNOSIS — Z79899 Other long term (current) drug therapy: Secondary | ICD-10-CM | POA: Insufficient documentation

## 2018-06-02 DIAGNOSIS — K565 Intestinal adhesions [bands], unspecified as to partial versus complete obstruction: Secondary | ICD-10-CM | POA: Diagnosis not present

## 2018-06-02 DIAGNOSIS — I252 Old myocardial infarction: Secondary | ICD-10-CM | POA: Diagnosis not present

## 2018-06-02 HISTORY — PX: ROBOTIC ASSISTED SALPINGO OOPHERECTOMY: SHX6082

## 2018-06-02 HISTORY — PX: LYSIS OF ADHESION: SHX5961

## 2018-06-02 SURGERY — SALPINGO-OOPHORECTOMY, ROBOT-ASSISTED
Anesthesia: General

## 2018-06-02 MED ORDER — ACETAMINOPHEN 650 MG RE SUPP
650.0000 mg | RECTAL | Status: DC | PRN
  Filled 2018-06-02: qty 1

## 2018-06-02 MED ORDER — MORPHINE SULFATE (PF) 4 MG/ML IV SOLN: 2.0000 mg | INTRAVENOUS | Status: DC | PRN

## 2018-06-02 MED ORDER — CELECOXIB 200 MG PO CAPS
400.0000 mg | ORAL_CAPSULE | ORAL | Status: AC
  Administered 2018-06-02: 400 mg via ORAL
  Filled 2018-06-02: qty 2

## 2018-06-02 MED ORDER — SUGAMMADEX SODIUM 200 MG/2ML IV SOLN
INTRAVENOUS | Status: AC
  Filled 2018-06-02: qty 2

## 2018-06-02 MED ORDER — LIDOCAINE 2% (20 MG/ML) 5 ML SYRINGE
INTRAMUSCULAR | Status: AC
  Filled 2018-06-02: qty 5

## 2018-06-02 MED ORDER — GABAPENTIN 300 MG PO CAPS
300.0000 mg | ORAL_CAPSULE | ORAL | Status: AC
  Administered 2018-06-02: 300 mg via ORAL
  Filled 2018-06-02: qty 1

## 2018-06-02 MED ORDER — ONDANSETRON HCL 4 MG/2ML IJ SOLN
INTRAMUSCULAR | Status: AC
  Filled 2018-06-02: qty 2

## 2018-06-02 MED ORDER — KETAMINE HCL 10 MG/ML IJ SOLN
INTRAMUSCULAR | Status: DC | PRN
  Administered 2018-06-02: 30 mg via INTRAVENOUS

## 2018-06-02 MED ORDER — MIDAZOLAM HCL 2 MG/2ML IJ SOLN
INTRAMUSCULAR | Status: AC
  Filled 2018-06-02: qty 2

## 2018-06-02 MED ORDER — ROCURONIUM BROMIDE 10 MG/ML (PF) SYRINGE
PREFILLED_SYRINGE | INTRAVENOUS | Status: DC | PRN
  Administered 2018-06-02: 50 mg via INTRAVENOUS

## 2018-06-02 MED ORDER — SODIUM CHLORIDE 0.9% FLUSH: 3.0000 mL | Freq: Two times a day (BID) | INTRAVENOUS | Status: DC

## 2018-06-02 MED ORDER — DEXAMETHASONE SODIUM PHOSPHATE 10 MG/ML IJ SOLN
INTRAMUSCULAR | Status: AC
  Filled 2018-06-02: qty 1

## 2018-06-02 MED ORDER — KETAMINE HCL 10 MG/ML IJ SOLN
INTRAMUSCULAR | Status: AC
  Filled 2018-06-02: qty 1

## 2018-06-02 MED ORDER — SUGAMMADEX SODIUM 200 MG/2ML IV SOLN
INTRAVENOUS | Status: DC | PRN
  Administered 2018-06-02: 200 mg via INTRAVENOUS

## 2018-06-02 MED ORDER — EPHEDRINE 5 MG/ML INJ
INTRAVENOUS | Status: AC
  Filled 2018-06-02: qty 10

## 2018-06-02 MED ORDER — DEXAMETHASONE SODIUM PHOSPHATE 4 MG/ML IJ SOLN: 4.0000 mg | INTRAMUSCULAR | Status: DC

## 2018-06-02 MED ORDER — OXYCODONE-ACETAMINOPHEN 5-325 MG PO TABS: 1.0000 | ORAL_TABLET | ORAL | 0 refills | Status: DC | PRN

## 2018-06-02 MED ORDER — LACTATED RINGERS IR SOLN
Status: DC | PRN
  Administered 2018-06-02: 1000 mL

## 2018-06-02 MED ORDER — PROPOFOL 10 MG/ML IV BOLUS
INTRAVENOUS | Status: AC
  Filled 2018-06-02: qty 20

## 2018-06-02 MED ORDER — FENTANYL CITRATE (PF) 250 MCG/5ML IJ SOLN
INTRAMUSCULAR | Status: AC
  Filled 2018-06-02: qty 5

## 2018-06-02 MED ORDER — PHENYLEPHRINE 40 MCG/ML (10ML) SYRINGE FOR IV PUSH (FOR BLOOD PRESSURE SUPPORT)
PREFILLED_SYRINGE | INTRAVENOUS | Status: DC | PRN
  Administered 2018-06-02: 80 ug via INTRAVENOUS

## 2018-06-02 MED ORDER — EPHEDRINE SULFATE-NACL 50-0.9 MG/10ML-% IV SOSY
PREFILLED_SYRINGE | INTRAVENOUS | Status: DC | PRN
  Administered 2018-06-02: 10 mg via INTRAVENOUS

## 2018-06-02 MED ORDER — KETOROLAC TROMETHAMINE 15 MG/ML IJ SOLN: 15.0000 mg | Freq: Once | INTRAMUSCULAR | Status: DC | PRN

## 2018-06-02 MED ORDER — SODIUM CHLORIDE 0.9 % IV SOLN: 250.0000 mL | INTRAVENOUS | Status: DC | PRN

## 2018-06-02 MED ORDER — PROPOFOL 10 MG/ML IV BOLUS
INTRAVENOUS | Status: DC | PRN
  Administered 2018-06-02: 150 mg via INTRAVENOUS

## 2018-06-02 MED ORDER — LIDOCAINE 2% (20 MG/ML) 5 ML SYRINGE
INTRAMUSCULAR | Status: AC
  Filled 2018-06-02: qty 10

## 2018-06-02 MED ORDER — FENTANYL CITRATE (PF) 250 MCG/5ML IJ SOLN
INTRAMUSCULAR | Status: DC | PRN
  Administered 2018-06-02 (×2): 100 ug via INTRAVENOUS
  Administered 2018-06-02: 50 ug via INTRAVENOUS

## 2018-06-02 MED ORDER — ROCURONIUM BROMIDE 10 MG/ML (PF) SYRINGE
PREFILLED_SYRINGE | INTRAVENOUS | Status: AC
  Filled 2018-06-02: qty 10

## 2018-06-02 MED ORDER — ACETAMINOPHEN 500 MG PO TABS
1000.0000 mg | ORAL_TABLET | ORAL | Status: AC
  Administered 2018-06-02: 1000 mg via ORAL
  Filled 2018-06-02: qty 2

## 2018-06-02 MED ORDER — LIDOCAINE 2% (20 MG/ML) 5 ML SYRINGE
INTRAMUSCULAR | Status: DC | PRN
  Administered 2018-06-02: 60 mg via INTRAVENOUS

## 2018-06-02 MED ORDER — DEXAMETHASONE SODIUM PHOSPHATE 10 MG/ML IJ SOLN
INTRAMUSCULAR | Status: DC | PRN
  Administered 2018-06-02: 10 mg via INTRAVENOUS

## 2018-06-02 MED ORDER — ONDANSETRON HCL 4 MG/2ML IJ SOLN
INTRAMUSCULAR | Status: DC | PRN
  Administered 2018-06-02: 4 mg via INTRAVENOUS

## 2018-06-02 MED ORDER — LIDOCAINE 2% (20 MG/ML) 5 ML SYRINGE
INTRAMUSCULAR | Status: DC | PRN
  Administered 2018-06-02: 1.5 mg/kg/h via INTRAVENOUS

## 2018-06-02 MED ORDER — SCOPOLAMINE 1 MG/3DAYS TD PT72
1.0000 | MEDICATED_PATCH | TRANSDERMAL | Status: DC
  Administered 2018-06-02: 1.5 mg via TRANSDERMAL
  Filled 2018-06-02: qty 1

## 2018-06-02 MED ORDER — MIDAZOLAM HCL 5 MG/5ML IJ SOLN
INTRAMUSCULAR | Status: DC | PRN
  Administered 2018-06-02: 2 mg via INTRAVENOUS

## 2018-06-02 MED ORDER — SODIUM CHLORIDE 0.9% FLUSH: 3.0000 mL | INTRAVENOUS | Status: DC | PRN

## 2018-06-02 MED ORDER — CEFAZOLIN SODIUM-DEXTROSE 2-4 GM/100ML-% IV SOLN
2.0000 g | INTRAVENOUS | Status: AC
  Administered 2018-06-02: 2 g via INTRAVENOUS
  Filled 2018-06-02: qty 100

## 2018-06-02 MED ORDER — LACTATED RINGERS IV SOLN
INTRAVENOUS | Status: DC
  Administered 2018-06-02: 06:00:00 via INTRAVENOUS

## 2018-06-02 MED ORDER — ACETAMINOPHEN 325 MG PO TABS: 650.0000 mg | ORAL_TABLET | ORAL | Status: DC | PRN

## 2018-06-02 MED ORDER — OXYCODONE HCL 5 MG PO TABS: 5.0000 mg | ORAL_TABLET | ORAL | Status: DC | PRN

## 2018-06-02 SURGICAL SUPPLY — 48 items
APPLICATOR SURGIFLO ENDO (HEMOSTASIS) IMPLANT
BAG LAPAROSCOPIC 12 15 PORT 16 (BASKET) IMPLANT
BAG RETRIEVAL 12/15 (BASKET)
COVER BACK TABLE 60X90IN (DRAPES) ×2 IMPLANT
COVER TIP SHEARS 8 DVNC (MISCELLANEOUS) ×1 IMPLANT
COVER TIP SHEARS 8MM DA VINCI (MISCELLANEOUS) ×1
DERMABOND ADVANCED (GAUZE/BANDAGES/DRESSINGS) ×1
DERMABOND ADVANCED .7 DNX12 (GAUZE/BANDAGES/DRESSINGS) ×1 IMPLANT
DRAPE ARM DVNC X/XI (DISPOSABLE) ×4 IMPLANT
DRAPE COLUMN DVNC XI (DISPOSABLE) ×1 IMPLANT
DRAPE DA VINCI XI ARM (DISPOSABLE) ×4
DRAPE DA VINCI XI COLUMN (DISPOSABLE) ×1
DRAPE SHEET LG 3/4 BI-LAMINATE (DRAPES) ×4 IMPLANT
DRAPE SURG IRRIG POUCH 19X23 (DRAPES) ×2 IMPLANT
ELECT REM PT RETURN 15FT ADLT (MISCELLANEOUS) ×2 IMPLANT
GLOVE BIO SURGEON STRL SZ 6 (GLOVE) ×8 IMPLANT
GLOVE BIO SURGEON STRL SZ 6.5 (GLOVE) ×4 IMPLANT
GOWN STRL REUS W/ TWL LRG LVL3 (GOWN DISPOSABLE) ×2 IMPLANT
GOWN STRL REUS W/TWL LRG LVL3 (GOWN DISPOSABLE) ×2
HOLDER FOLEY CATH W/STRAP (MISCELLANEOUS) ×2 IMPLANT
IRRIG SUCT STRYKERFLOW 2 WTIP (MISCELLANEOUS) ×2
IRRIGATION SUCT STRKRFLW 2 WTP (MISCELLANEOUS) ×1 IMPLANT
KIT PROCEDURE DA VINCI SI (MISCELLANEOUS)
KIT PROCEDURE DVNC SI (MISCELLANEOUS) IMPLANT
MANIPULATOR UTERINE 4.5 ZUMI (MISCELLANEOUS) ×2 IMPLANT
NEEDLE SPNL 18GX3.5 QUINCKE PK (NEEDLE) IMPLANT
OBTURATOR OPTICAL STANDARD 8MM (TROCAR) ×1
OBTURATOR OPTICAL STND 8 DVNC (TROCAR) ×1
OBTURATOR OPTICALSTD 8 DVNC (TROCAR) ×1 IMPLANT
PACK ROBOT GYN CUSTOM WL (TRAY / TRAY PROCEDURE) ×2 IMPLANT
PAD POSITIONING PINK XL (MISCELLANEOUS) ×2 IMPLANT
PORT ACCESS TROCAR AIRSEAL 12 (TROCAR) ×1 IMPLANT
PORT ACCESS TROCAR AIRSEAL 5M (TROCAR) ×1
POSITIONER SURGICAL ARM (MISCELLANEOUS) ×2 IMPLANT
POUCH SPECIMEN RETRIEVAL 10MM (ENDOMECHANICALS) ×2 IMPLANT
SCISSORS LAP 5X35 DISP (ENDOMECHANICALS) ×2 IMPLANT
SEAL CANN UNIV 5-8 DVNC XI (MISCELLANEOUS) ×4 IMPLANT
SEAL XI 5MM-8MM UNIVERSAL (MISCELLANEOUS) ×4
SET TRI-LUMEN FLTR TB AIRSEAL (TUBING) ×2 IMPLANT
SURGIFLO W/THROMBIN 8M KIT (HEMOSTASIS) IMPLANT
SUT VIC AB 0 CT1 27 (SUTURE)
SUT VIC AB 0 CT1 27XBRD ANTBC (SUTURE) IMPLANT
SYR 10ML LL (SYRINGE) IMPLANT
TOWEL OR NON WOVEN STRL DISP B (DISPOSABLE) ×2 IMPLANT
TRAP SPECIMEN MUCOUS 40CC (MISCELLANEOUS) ×2 IMPLANT
TRAY FOLEY MTR SLVR 16FR STAT (SET/KITS/TRAYS/PACK) ×2 IMPLANT
UNDERPAD 30X30 (UNDERPADS AND DIAPERS) ×2 IMPLANT
WATER STERILE IRR 1000ML POUR (IV SOLUTION) ×2 IMPLANT

## 2018-06-02 NOTE — Transfer of Care (Signed)
Immediate Anesthesia Transfer of Care Note  Patient: JACORIA KEIFFER  Procedure(s) Performed: XI ROBOTIC ASSISTED UNILATERAL SALPINGO OOPHORECTOMY (N/A ) LYSIS OF ADHESION (N/A )  Patient Location: PACU  Anesthesia Type:General  Level of Consciousness: awake, alert , oriented and patient cooperative  Airway & Oxygen Therapy: Patient Spontanous Breathing and Patient connected to face mask oxygen  Post-op Assessment: Report given to RN, Post -op Vital signs reviewed and stable and Patient moving all extremities  Post vital signs: Reviewed and stable  Last Vitals:  Vitals Value Taken Time  BP 145/92 06/02/2018  9:37 AM  Temp    Pulse 83 06/02/2018  9:40 AM  Resp    SpO2 100 % 06/02/2018  9:40 AM  Vitals shown include unvalidated device data.  Last Pain:  Vitals:   06/02/18 0559  TempSrc:   PainSc: 0-No pain      Patients Stated Pain Goal: 3 (64/68/03 2122)  Complications: No apparent anesthesia complications

## 2018-06-02 NOTE — Discharge Instructions (Signed)
Unilateral Salpingo-Oophorectomy, Care After °Refer to this sheet in the next few weeks. These instructions provide you with information on caring for yourself after your procedure. Your health care provider may also give you more specific instructions. Your treatment has been planned according to current medical practices, but problems sometimes occur. Call your health care provider if you have any problems or questions after your procedure. °What can I expect after the procedure? °After the procedure, it is typical to have the following: °· Abdominal pain that can be controlled with pain medicine. °· Vaginal spotting. °· Constipation. ° °Follow these instructions at home: °· Get plenty of rest and sleep. °· Only take over-the-counter or prescription medicines as directed by your health care provider. Do not take aspirin. It can cause bleeding. °· Keep incision areas clean and dry. Remove or change any bandages (dressings) only as directed by your health care provider. °· Follow your health care provider's advice regarding diet. °· Drink enough fluids to keep your urine clear or pale yellow. °· Limit exercise and activities as directed by your health care provider. Do not lift anything heavier than 5 pounds (2.3 kg) until your health care provider approves. °· Do not drive until your health care provider approves. °· Do not drink alcohol until your health care provider approves. °· Do not have sexual intercourse until your health care provider says it is OK. °· Take your temperature twice a day and write it down. °· If you become constipated, you may: °? Ask your health care provider about taking a mild laxative. °? Add more fruit and bran to your diet. °? Drink more fluids. °· Follow up with your health care provider as directed. °Contact a health care provider if: °· You have swelling or redness in the incision area. °· You develop a rash. °· You feel lightheaded. °· You have pain that is not controlled with  medicine. °· You have pain, swelling, or redness where the IV access tube was placed. °Get help right away if: °· You have a fever. °· You develop increasing abdominal pain. °· You see pus coming out of the incision, or the incision is separating. °· You notice a bad smell coming from the wound or dressing. °· You have excessive vaginal bleeding. °· You feel sick to your stomach (nauseous) and vomit. °· You have leg or chest pain. °· You have pain when you urinate. °· You develop shortness of breath. °· You pass out. °This information is not intended to replace advice given to you by your health care provider. Make sure you discuss any questions you have with your health care provider. °Document Released: 08/10/2009 Document Revised: 09/13/2016 Document Reviewed: 04/07/2013 °Elsevier Interactive Patient Education © 2018 [employer] ° °

## 2018-06-02 NOTE — Anesthesia Preprocedure Evaluation (Addendum)
Anesthesia Evaluation  Patient identified by MRN, date of birth, ID band Patient awake    Reviewed: Allergy & Precautions, NPO status , Patient's Chart, lab work & pertinent test results  History of Anesthesia Complications (+) PONV  Airway Mallampati: II  TM Distance: >3 FB Neck ROM: Full    Dental no notable dental hx.    Pulmonary neg pulmonary ROS, former smoker,    Pulmonary exam normal breath sounds clear to auscultation       Cardiovascular hypertension, + CAD, + Past MI and + Cardiac Stents  Normal cardiovascular exam Rhythm:Regular Rate:Normal     Neuro/Psych negative neurological ROS  negative psych ROS   GI/Hepatic negative GI ROS, Neg liver ROS,   Endo/Other  negative endocrine ROS  Renal/GU negative Renal ROS  negative genitourinary   Musculoskeletal negative musculoskeletal ROS (+)   Abdominal   Peds negative pediatric ROS (+)  Hematology negative hematology ROS (+)   Anesthesia Other Findings   Reproductive/Obstetrics negative OB ROS                            Anesthesia Physical Anesthesia Plan  ASA: III  Anesthesia Plan: General   Post-op Pain Management:    Induction: Intravenous  PONV Risk Score and Plan: 4 or greater and Ondansetron, Dexamethasone, Midazolam, Scopolamine patch - Pre-op and Treatment may vary due to age or medical condition  Airway Management Planned: Oral ETT  Additional Equipment:   Intra-op Plan:   Post-operative Plan: Extubation in OR  Informed Consent: I have reviewed the patients History and Physical, chart, labs and discussed the procedure including the risks, benefits and alternatives for the proposed anesthesia with the patient or authorized representative who has indicated his/her understanding and acceptance.   Dental advisory given  Plan Discussed with: CRNA and Surgeon  Anesthesia Plan Comments:        Anesthesia  Quick Evaluation

## 2018-06-02 NOTE — Anesthesia Procedure Notes (Signed)
Procedure Name: Intubation Date/Time: 06/02/2018 7:40 AM Performed by: Mitzie Na, CRNA Pre-anesthesia Checklist: Patient identified, Emergency Drugs available, Suction available, Patient being monitored and Timeout performed Patient Re-evaluated:Patient Re-evaluated prior to induction Oxygen Delivery Method: Circle system utilized Preoxygenation: Pre-oxygenation with 100% oxygen Induction Type: IV induction Ventilation: Mask ventilation without difficulty Laryngoscope Size: Mac and 3 Grade View: Grade I Tube type: Oral Tube size: 7.0 mm Number of attempts: 1 Airway Equipment and Method: Stylet Placement Confirmation: ETT inserted through vocal cords under direct vision,  positive ETCO2 and breath sounds checked- equal and bilateral Secured at: 22 cm Tube secured with: Tape Dental Injury: Teeth and Oropharynx as per pre-operative assessment

## 2018-06-02 NOTE — Anesthesia Postprocedure Evaluation (Signed)
Anesthesia Post Note  Patient: Brandi Rogers  Procedure(s) Performed: XI ROBOTIC ASSISTED UNILATERAL SALPINGO OOPHORECTOMY (N/A ) LYSIS OF ADHESION (N/A )     Patient location during evaluation: PACU Anesthesia Type: General Level of consciousness: awake and alert Pain management: pain level controlled Vital Signs Assessment: post-procedure vital signs reviewed and stable Respiratory status: spontaneous breathing, nonlabored ventilation, respiratory function stable and patient connected to nasal cannula oxygen Cardiovascular status: blood pressure returned to baseline and stable Postop Assessment: no apparent nausea or vomiting Anesthetic complications: no    Last Vitals:  Vitals:   06/02/18 1115 06/02/18 1130  BP: 127/80 119/82  Pulse: 95 82  Resp: (!) 28 17  Temp:    SpO2: 98% 93%    Last Pain:  Vitals:   06/02/18 1100  TempSrc:   PainSc: Asleep                 Zema Lizardo S

## 2018-06-02 NOTE — Interval H&P Note (Signed)
History and Physical Interval Note:  06/02/2018 6:27 AM  Brandi Rogers  has presented today for surgery, with the diagnosis of let ovarian cyst  The various methods of treatment have been discussed with the patient and family. After consideration of risks, benefits and other options for treatment, the patient has consented to  Procedure(s): XI ROBOTIC ASSISTED UNILATERAL SALPINGO OOPHORECTOMY (N/A) LYSIS OF ADHESION (N/A) POSSIBLE LAPAROTOMY AND POSSIBLE STAGING (N/A) as a surgical intervention .  The patient's history has been reviewed, patient examined, no change in status, stable for surgery.  I have reviewed the patient's chart and labs.  Questions were answered to the patient's satisfaction.     Thereasa Solo

## 2018-06-02 NOTE — Op Note (Signed)
OPERATIVE NOTE  Date: 06/02/18  Preoperative Diagnosis: left ovarian cyst, fibroid uterus   Postoperative Diagnosis:  Same and pelvic and abdominal adhesive disease  Procedure(s) Performed: Robotic-assisted laparoscopic lysis of adhesions, left salpingo-oophorectomy  Surgeon: Everitt Amber, M.D.  Assistant Surgeon: Lahoma Crocker M.D. (an MD assistant was necessary for tissue manipulation, management of robotic instrumentation, retraction and positioning due to the complexity of the case and hospital policies).   Anesthesia: Gen. endotracheal.  Specimens: Left fallopian tube and ovary, pelvic washings  Estimated Blood Loss: <20 mL. Blood Replacement: None  Complications: none  Indication for Procedure:  Constipation, pelvic pressure, left ovarian cyst on [place], history of complex abdominal surgery  Operative Findings: adhesions between omentum and anterior adominal wall. Adhesions between ileum and right sidewall. Adhesions between sigmoid colon and uterus (see Epic images). 12cm unilocular left ovarian cyst, densely adherent to posterior uterus, left cul de sac and sigmoid colon. Unavoidable cyst rupture during dissection of adhesions. Frozen section revealed benign cystadenoma.  Normal appearing right tube and ovary. Uterus contained a 6cm anterior right lower uterine segment fibroid (intramural).   Frozen pathology was consistent with benign cystadenoma.   Procedure: The patient's taken to the operating room and placed under general endotracheal anesthesia testing difficulty. She is placed in a dorsolithotomy position and cervical acromial pad was placed. The arms were tucked with care taken to pad the olecranon process. And prepped and draped in usual sterile fashion. A uterine manipulator (zumi) was placed vaginally. A 71mm incision was made in the left upper quadrant palmer's point and a 5 mm Optiview trocar used to enter the abdomen under direct visualization. With entry into the  abdomen and then maintenance of 15 mm of mercury the patient was placed in Trendelenburg position. An incision was made in the umbilicus and a 32IZ trochar was placed through this site. Two incisions were made lateral to the umbilical incision in the left and right abdomen measuring 62mm. These incisions were made approximately 10 cm lateral to the umbilical incision. 8 mm robotic trochars were inserted. Adhesiolysis for 10 minutes was performed with scissors to separate the omentum from the anterior abdominal wall. The robot was docked.  The abdomen was inspected as was the pelvis.  Pelvic washings were obtained. For approximately 30 minutes, sharp adhesiolysis was performed to separate the ovary from its attachments to the uterus, left sidewall and sigmoid colon. In doing so there was unavoidable rupture of the cyst due to dense fibrous adhesions. An incision was made on the left pelvic side wall peritoneum parallel to the IP ligament and the retroperitoneal space entered. The left ureter was identified and the para-rectal space was developed. A window was created in the left broad ligament above the ureter. The left infundibulopelvic vessels were skeletonized cauterized and transected. The utero-ovarian ligaments similarly were cauterized and transected. Specimen was placed in an Endo Catch bag.  The abdomen was copiously irrigated and drained and all operative sites inspected and hemostasis was assured  The robot was undocked.The contents of the left Endo Catch bag were delivered through the LUQ incision.   The ports were all remove. The fascial closure at the umbilical incision and left upper quadrant port was made with 0 Vicryl. All incisions were closed with a running subcuticular Monocryl suture. Dermabond was applied. Sponge, lap and needle counts were correct x 3.    The patient had sequential compression devices for VTE prophylaxis.         Disposition: PACU -stable  Condition:  stable  Donaciano Eva, MD

## 2018-06-03 ENCOUNTER — Encounter (HOSPITAL_COMMUNITY): Payer: Self-pay | Admitting: Gynecologic Oncology

## 2018-06-08 ENCOUNTER — Telehealth: Payer: Self-pay | Admitting: *Deleted

## 2018-06-08 NOTE — Telephone Encounter (Signed)
Called patient to tell her surgical pathology report.  Per Joylene John, NP her surgical path is benign.  Patient states that she has done well after surgery, very little pain, no trouble with eating or drinking, and no trouble with urinating or bowel movements.  I told patient to give our office a call if she had any questions or concerns.  Patient verbalized understanding.

## 2018-06-15 NOTE — Progress Notes (Signed)
NEUROLOGY CONSULTATION NOTE  Brandi Rogers MRN: [id number] DOB: 04/28/1961  Referring provider: Emi Belfast, MD Primary care provider: Emi Belfast, MD  Reason for consult:  Dizziness, Arnold-Chiari malformation  HISTORY OF PRESENT ILLNESS: Brandi Rogers is a 57 year old right-handed female with hypertension, CAD s/p MI and stent placement, depression, insomnia, OSA, and fibromyalgia who presents for dizziness and Arnold-Chiari malformation.  History supplemented by referring provider's note.  She has a Chiari malformation.  She was diagnosed at age 83.  She had severe vertigo for about 2 months.  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  Surgery was not discussed.  She was treated with diazepam 2mg  as needed for vertigo as needed.  Overall, it is okay but keeps diazepam on-hand just in case.  Meclizine has been ineffective. Sometimes she may have a flare up of dizziness such as when she is laying flat during exercise and room will spin for a few seconds.  She denies double vision.  When she sleeps her arms will go numb but otherwise no numbness, pain or weakness in arms. She denies trouble with balance.  She denies problems with swallowing.  She denies neck pain.    Family History: Brother - Multiple Sclerosis Maternal Grandmother - died due to cerebral aneurysm rupture  PAST MEDICAL HISTORY: Past Medical History:  Diagnosis Date  . Abnormal Pap smear of cervix 1994   cin3  . Complication of anesthesia   . Depression   . Fibromyalgia   . Heart attack ([place])   . Hypertension   . PONV (postoperative nausea and vomiting)     PAST SURGICAL HISTORY: Past Surgical History:  Procedure Laterality Date  . ABDOMINOPLASTY/PANNICULECTOMY WITH LIPOSUCTION  2010  . APPENDECTOMY    . BACK SURGERY  1996  . BOWEL RESECTION  04/2003   ruptured appendix with abscess and small and large bowel resection  . CERVICAL CONE  BIOPSY  1993?  Marland Kitchen LYSIS OF ADHESION N/A 06/02/2018   Procedure: LYSIS OF ADHESION;  Surgeon: Everitt Amber, MD;  Location: [facility];  Service: Gynecology;  Laterality: N/A;  . ROBOTIC ASSISTED SALPINGO OOPHERECTOMY N/A 06/02/2018   Procedure: XI ROBOTIC ASSISTED UNILATERAL SALPINGO OOPHORECTOMY;  Surgeon: Everitt Amber, MD;  Location: [facility];  Service: Gynecology;  Laterality: N/A;  . salivary gland removal Right 7/13    MEDICATIONS: Current Outpatient Medications on File Prior to Visit  Medication Sig Dispense Refill  . ALPRAZolam (XANAX) 0.5 MG tablet Take 0.5 mg by mouth at bedtime as needed for sleep.    Marland Kitchen aspirin 81 MG chewable tablet Chew 81 mg by mouth daily.    . carisoprodol (SOMA) 350 MG tablet Take 350 mg by mouth as needed for muscle spasms  0  . carvedilol (COREG) 3.125 MG tablet Take 3.125 mg by mouth once daily at night    . cetirizine (ZYRTEC) 10 MG tablet Take 10 mg by mouth daily.    . Cholecalciferol (VITAMIN D) 2000 units tablet Take 2,000 Units by mouth every evening.    . cyanocobalamin (,VITAMIN B-12,) 1000 MCG/ML injection INJECT 1 ML INTO MUSCLE Q 2-3 WEEKS UTD  5  . diazepam (VALIUM) 2 MG tablet Take 2 mg by mouth daily as needed (vertigo).    Marland Kitchen escitalopram (LEXAPRO) 20 MG tablet Take 10 mg by mouth daily.     . fexofenadine (ALLEGRA) 180 MG tablet Take 180 mg by mouth daily as needed  for allergies or rhinitis.    . fluticasone (FLONASE) 50 MCG/ACT nasal spray Use 2 sprays in each nostril once daily as needed for allergies  1  . hyoscyamine (LEVSIN SL) 0.125 MG SL tablet Take 0.25 mg by mouth daily as needed for cramping.     Marland Kitchen HYZAAR 100-25 MG tablet Take 1 tablet by mouth once daily  2  . ibuprofen (ADVIL,MOTRIN) 200 MG tablet Take 400 mg by mouth daily as needed for headache or moderate pain.    . metroNIDAZOLE (METROGEL) 1 % gel Apply 1 application topically daily as needed (Rosacea). Apply to face    . nitroGLYCERIN (NITROSTAT) 0.4 MG SL tablet Place 0.4 mg under the  tongue every 5 (five) minutes as needed for chest pain.     Marland Kitchen omeprazole (PRILOSEC) 40 MG capsule Take 40 mg by mouth daily.    Marland Kitchen oxyCODONE-acetaminophen (PERCOCET/ROXICET) 5-325 MG tablet Take 1-2 tablets by mouth every 4 (four) hours as needed for severe pain. 10 tablet 0  . Probiotic Product (ALIGN) 4 MG CAPS Take 4 mg by mouth daily as needed (ibs symptoms).    . rosuvastatin (CRESTOR) 20 MG tablet Take 20 mg by mouth daily.     No current facility-administered medications on file prior to visit.     ALLERGIES: Allergies  Allergen Reactions  . Paroxetine Hcl Other (See Comments)    Hypoglycemia    FAMILY HISTORY: Family History  Problem Relation Age of Onset  . Hypertension Mother   . Stroke Mother   . Cancer Father        lung  . Hypertension Sister   . Hypertension Brother   . Multiple sclerosis Brother   . Hypertension Maternal Grandmother   . Colon cancer Maternal Aunt     SOCIAL HISTORY: Social History   Socioeconomic History  . Marital status: Married    Spouse name: Not on file  . Number of children: Not on file  . Years of education: Not on file  . Highest education level: Not on file  Occupational History  . Not on file  Social Needs  . Financial resource strain: Not on file  . Food insecurity:    Worry: Not on file    Inability: Not on file  . Transportation needs:    Medical: Not on file    Non-medical: Not on file  Tobacco Use  . Smoking status: Former Smoker    Years: 30.00  . Smokeless tobacco: Never Used  Substance and Sexual Activity  . Alcohol use: Yes    Alcohol/week: 8.0 standard drinks    Types: 8 Glasses of wine per week    Comment: ocassional  . Drug use: No  . Sexual activity: Yes    Partners: Male    Comment: husband vasectomy  Lifestyle  . Physical activity:    Days per week: Not on file    Minutes per session: Not on file  . Stress: Not on file  Relationships  . Social connections:    Talks on phone: Not on file     Gets together: Not on file    Attends religious service: Not on file    Active member of club or organization: Not on file    Attends meetings of clubs or organizations: Not on file    Relationship status: Not on file  . Intimate partner violence:    Fear of current or ex partner: Not on file    Emotionally abused: Not on file  Physically abused: Not on file    Forced sexual activity: Not on file  Other Topics Concern  . Not on file  Social History Narrative  . Not on file    REVIEW OF SYSTEMS: Constitutional: No fevers, chills, or sweats, no generalized fatigue, change in appetite Eyes: No visual changes, double vision, eye pain Ear, nose and throat: No hearing loss, ear pain, nasal congestion, sore throat Cardiovascular: No chest pain, palpitations Respiratory:  No shortness of breath at rest or with exertion, wheezes GastrointestinaI: No nausea, vomiting, diarrhea, abdominal pain, fecal incontinence Genitourinary:  No dysuria, urinary retention or frequency Musculoskeletal:  No neck pain, back pain Integumentary: No rash, pruritus, skin lesions Neurological: as above Psychiatric: No depression, insomnia, anxiety Endocrine: No palpitations, fatigue, diaphoresis, mood swings, change in appetite, change in weight, increased thirst Hematologic/Lymphatic:  No purpura, petechiae. Allergic/Immunologic: no itchy/runny eyes, nasal congestion, recent allergic reactions, rashes  PHYSICAL EXAM: Blood pressure 124/74, pulse 90, height 5\' 3"  (1.6 m), weight 174 lb (78.9 kg), last menstrual period 05/28/2013, SpO2 97 %. General: No acute distress.  Patient appears well-groomed.  Head:  Normocephalic/atraumatic Eyes:  fundi examined but not visualized Neck: supple, no paraspinal tenderness, full range of motion Back: No paraspinal tenderness Heart: regular rate and rhythm Lungs: Clear to auscultation bilaterally. Vascular: No carotid bruits. Neurological Exam: Mental status: alert and  oriented to person, place, and time, recent and remote memory intact, fund of knowledge intact, attention and concentration intact, speech fluent and not dysarthric, language intact. Cranial nerves: CN I: not tested CN II: pupils equal, round and reactive to light, visual fields intact CN III, IV, VI:  full range of motion, no nystagmus, no ptosis CN V: facial sensation intact CN VII: upper and lower face symmetric CN VIII: hearing intact CN IX, X: gag intact, uvula midline CN XI: sternocleidomastoid and trapezius muscles intact CN XII: tongue midline Bulk & Tone: normal, no fasciculations. Motor:  5/5 throughout  Sensation: temperature and vibration sensation intact. Deep Tendon Reflexes:  2+ throughout, toes downgoing.  Finger to nose testing:  Without dysmetria.  Heel to shin:  Without dysmetria.  Gait:  Normal station and stride.  Able to turn and tandem walk. Romberg negative.  IMPRESSION: Arnold-Chiari malformation.  Asymptomatic.  I suspect the initial vertigo from over 20 years ago was unrelated.  PLAN: Since she has been asymptomatic and doing well, we can defer repeat imaging at this time.  She may follow up as needed (if she exhibits new or concerning symptoms)  Thank you for allowing me to take part in the care of this patient.  40 minutes spent face to face with patient, over 50% spent discussing diagnosis and management.  Metta Clines, DO  CC:  Emi Belfast, MD

## 2018-06-16 ENCOUNTER — Ambulatory Visit: Payer: BLUE CROSS/BLUE SHIELD | Admitting: Neurology

## 2018-06-16 ENCOUNTER — Encounter: Payer: Self-pay | Admitting: Neurology

## 2018-06-16 VITALS — BP 124/74 | HR 90 | Ht 63.0 in | Wt 174.0 lb

## 2018-06-16 DIAGNOSIS — Q0700 Arnold-Chiari syndrome without spina bifida or hydrocephalus: Secondary | ICD-10-CM

## 2018-06-16 NOTE — Patient Instructions (Addendum)
I think the initially vertigo was likely inner ear and unrelated to the Chiari malformation.  You have been doing well for over 20 years.  I don't think repeat imaging or other workup is warranted at this time.  If you should have any concerning symptoms, please make follow up appointment.

## 2018-06-23 DIAGNOSIS — Z1231 Encounter for screening mammogram for malignant neoplasm of breast: Secondary | ICD-10-CM | POA: Diagnosis not present

## 2018-06-24 ENCOUNTER — Inpatient Hospital Stay: Payer: BLUE CROSS/BLUE SHIELD | Attending: Gynecologic Oncology | Admitting: Gynecologic Oncology

## 2018-06-24 ENCOUNTER — Encounter: Payer: Self-pay | Admitting: Gynecologic Oncology

## 2018-06-24 VITALS — BP 124/89 | HR 90 | Temp 97.7°F | Resp 18 | Ht 63.0 in | Wt 171.7 lb

## 2018-06-24 DIAGNOSIS — Z90721 Acquired absence of ovaries, unilateral: Secondary | ICD-10-CM | POA: Insufficient documentation

## 2018-06-24 DIAGNOSIS — N83202 Unspecified ovarian cyst, left side: Secondary | ICD-10-CM | POA: Insufficient documentation

## 2018-06-24 NOTE — Patient Instructions (Signed)
Please contact Dr Serita Grit office at [phone] with any questions related to your surgery.  Please follow-up with Dr Quincy Simmonds in 1 year or as scheduled.

## 2018-06-24 NOTE — Progress Notes (Signed)
Follow-up Note: Gyn-Onc  Consult was requested by Dr. Sabra Heck for the evaluation of Brandi Rogers 57 y.o. female  CC:  Chief Complaint  Patient presents with  . Left ovarian cyst    Assessment/Plan:  Brandi Rogers  is a 57 y.o.  year old with a history of a left ovarian cyst (12cm) s/p robotic assisted LOA and LSO for a benign cystadenofibroma on 06/02/18, who is doing well postop.  She will follow-up with Dr Quincy Simmonds in 12 months. Discussed that there is no need to perform hysterectomy for the right lower uterine segment fibroid while it is asymptomatic.    HPI: Brandi Rogers is a 57 year old P3 who is seen in consultation at the request of Dr. Sabra Heck for a large left-sided ovarian cyst.  The patient's reports knowing that she has a fibroid uterus however she began feeling a more noticeable left lower abdominal mass and some discomfort in June 2019.  She was seen and evaluated by Dr. Sabra Heck who performed a transvaginal ultrasound scan on April 25, 2018 which revealed an anteverted enlarged uterus measuring 8.1 x 5.3 x 2.8 cm and including a 6 cm fundal fibroid.  The endometrium was 4.7 mm thick.  The right ovary was grossly normal and measure 3.3 x 2.1 x 1.3 cm.  The left ovary was not seen but instead a large thin-walled cystic structure measuring 11 x 8 x 8 cm was seen.  This was avascular with scattered echogenic debris throughout.  He had an appearance of a serous cystadenoma.  No adnexal masses were identified separate to this.  No free fluid was seen.  Ca1 25 was drawn on April 23, 2018 and was normal at 7.3.  The patient reports that she has had 3 prior vaginal deliveries.  She has no significant family history for uterine, breast, ovarian cancer.  Her surgical history is significant for a prior exploratory laparotomy in [place] for a ruptured appendix with abscess formation and small and large bowel resection necessary.  This was performed in July 2004.  Postoperatively she stayed in  hospital for approximately 7 days with an ileus.  She has had no issues since that time and no additional surgeries.  Her medical history is significant for early onset hypertension.  This is likely the reason for why she developed coronary disease and she experienced a myocardial infarction in 2016.  This was treated with at [facility].  Dr. Tonna Corner is her interventional cardiologist and Dr. Mauricio Po is her cardiologist.  She sees him once a year.  She denies shortness of breath or chest pain.  She is no longer on antiplatelet therapy but takes 81 mg of aspirin daily.  Interval Hx:  On 06/02/18 she underwent robotic assisted lysis of adhesions, left salpingo-oophorectomy. Intraoperative findings were significant for sigmoidal adhesions with the uterus and left ovary, normal right ovary, right lower uterine segment/broad ligament fibroid. See Epic images. Pathology revealed a benign cystadenoma. She did well postop with no issues.   Current Meds:  Outpatient Encounter Medications as of 06/24/2018  Medication Sig  . ALPRAZolam (XANAX) 0.5 MG tablet Take 0.5 mg by mouth at bedtime as needed for sleep.  Marland Kitchen aspirin 81 MG chewable tablet Chew 81 mg by mouth daily.  . carisoprodol (SOMA) 350 MG tablet Take 350 mg by mouth as needed for muscle spasms  . carvedilol (COREG) 3.125 MG tablet Take 3.125 mg by mouth once daily at night  . cetirizine (ZYRTEC) 10 MG tablet Take 10  mg by mouth as needed.   . Cholecalciferol (VITAMIN D) 2000 units tablet Take 2,000 Units by mouth every evening.  . cyanocobalamin (,VITAMIN B-12,) 1000 MCG/ML injection INJECT 1 ML INTO MUSCLE Q 2-3 WEEKS UTD  . diazepam (VALIUM) 2 MG tablet Take 2 mg by mouth daily as needed (vertigo).  Marland Kitchen escitalopram (LEXAPRO) 20 MG tablet Take 10 mg by mouth daily.   . fexofenadine (ALLEGRA) 180 MG tablet Take 180 mg by mouth daily as needed for allergies or rhinitis.  . fluticasone (FLONASE) 50 MCG/ACT nasal spray Use 2 sprays in each nostril once  daily as needed for allergies  . hyoscyamine (LEVSIN SL) 0.125 MG SL tablet Take 0.25 mg by mouth daily as needed for cramping.   Marland Kitchen HYZAAR 100-25 MG tablet Take 1 tablet by mouth once daily  . ibuprofen (ADVIL,MOTRIN) 200 MG tablet Take 400 mg by mouth daily as needed for headache or moderate pain.  . metroNIDAZOLE (METROGEL) 1 % gel Apply 1 application topically daily as needed (Rosacea). Apply to face  . nitroGLYCERIN (NITROSTAT) 0.4 MG SL tablet Place 0.4 mg under the tongue every 5 (five) minutes as needed for chest pain.   Marland Kitchen omeprazole (PRILOSEC) 40 MG capsule Take 40 mg by mouth daily.  . Probiotic Product (ALIGN) 4 MG CAPS Take 4 mg by mouth daily as needed (ibs symptoms).  . rosuvastatin (CRESTOR) 20 MG tablet Take 20 mg by mouth daily.  . [DISCONTINUED] oxyCODONE-acetaminophen (PERCOCET/ROXICET) 5-325 MG tablet Take 1-2 tablets by mouth every 4 (four) hours as needed for severe pain. (Patient not taking: Reported on 06/24/2018)   No facility-administered encounter medications on file as of 06/24/2018.     Allergy:  Allergies  Allergen Reactions  . Paroxetine Hcl Other (See Comments)    Hypoglycemia    Social Hx:   Social History   Socioeconomic History  . Marital status: Married    Spouse name: Brandi Rogers  . Number of children: 3  . Years of education: Not on file  . Highest education level: 12th grade  Occupational History  . Occupation: homemaker  Social Needs  . Financial resource strain: Not on file  . Food insecurity:    Worry: Not on file    Inability: Not on file  . Transportation needs:    Medical: Not on file    Non-medical: Not on file  Tobacco Use  . Smoking status: Former Smoker    Years: 30.00  . Smokeless tobacco: Never Used  Substance and Sexual Activity  . Alcohol use: Yes    Alcohol/week: 8.0 standard drinks    Types: 8 Glasses of wine per week    Comment: ocassional  . Drug use: No  . Sexual activity: Yes    Partners: Male    Comment: husband  vasectomy  Lifestyle  . Physical activity:    Days per week: Not on file    Minutes per session: Not on file  . Stress: Not on file  Relationships  . Social connections:    Talks on phone: Not on file    Gets together: Not on file    Attends religious service: Not on file    Active member of club or organization: Not on file    Attends meetings of clubs or organizations: Not on file    Relationship status: Not on file  . Intimate partner violence:    Fear of current or ex partner: Not on file    Emotionally abused: Not on file  Physically abused: Not on file    Forced sexual activity: Not on file  Other Topics Concern  . Not on file  Social History Narrative   Patient is right-handed. She lives with her husband in a 2 story house, Restaurant manager, fast food on main floor. She has been unable to exercise recently due tom hysterectomy.     Past Surgical Hx:  Past Surgical History:  Procedure Laterality Date  . ABDOMINOPLASTY/PANNICULECTOMY WITH LIPOSUCTION  2010  . APPENDECTOMY    . BACK SURGERY  1996  . BOWEL RESECTION  04/2003   ruptured appendix with abscess and small and large bowel resection  . CERVICAL CONE BIOPSY  1993?  Marland Kitchen LYSIS OF ADHESION N/A 06/02/2018   Procedure: LYSIS OF ADHESION;  Surgeon: Everitt Amber, MD;  Location: [facility];  Service: Gynecology;  Laterality: N/A;  . ROBOTIC ASSISTED SALPINGO OOPHERECTOMY N/A 06/02/2018   Procedure: XI ROBOTIC ASSISTED UNILATERAL SALPINGO OOPHORECTOMY;  Surgeon: Everitt Amber, MD;  Location: [facility];  Service: Gynecology;  Laterality: N/A;  . salivary gland removal Right 7/13    Past Medical Hx:  Past Medical History:  Diagnosis Date  . Abnormal Pap smear of cervix 1994   cin3  . Complication of anesthesia   . Depression   . Fibromyalgia   . Heart attack ([place])   . Hypertension   . PONV (postoperative nausea and vomiting)     Past Gynecological History:  SVD x 3 Patient's last menstrual period was 05/28/2013.  Family Hx:  Family History   Problem Relation Age of Onset  . Hypertension Mother   . Stroke Mother   . Cancer Father        lung  . Hypertension Sister   . Hypertension Brother   . Multiple sclerosis Brother   . Hypertension Maternal Grandmother   . Anuerysm Maternal Grandmother 72  . Colon cancer Maternal Aunt   . Heart disease Maternal Grandfather   . Other Paternal Grandmother 9  . Heart attack Paternal Grandfather 82    Review of Systems:  Constitutional  Feels well,    ENT Normal appearing ears and nares bilaterally Skin/Breast  No rash, sores, jaundice, itching, dryness Cardiovascular  No chest pain, shortness of breath, or edema  Pulmonary  No cough or wheeze.  Gastro Intestinal  No nausea, vomitting, or diarrhoea. No bright red blood per rectum, no abdominal pain, change in bowel movement, or constipation.  Genito Urinary  No frequency, urgency, dysuria, + pelvic pressure Musculo Skeletal  No myalgia, arthralgia, joint swelling or pain  Neurologic  No weakness, numbness, change in gait,  Psychology  No depression, anxiety, insomnia.   Vitals:  Blood pressure 124/89, pulse 90, temperature 97.7 F (36.5 C), temperature source Oral, resp. rate 18, height 5\' 3"  (1.6 m), weight 171 lb 11.2 oz (77.9 kg), last menstrual period 05/28/2013, SpO2 100 %.  Physical Exam: WD in NAD Neck  Supple NROM, without any enlargements.  Lymph Node Survey No cervical supraclavicular or inguinal adenopathy Cardiovascular  Pulse normal rate, regularity and rhythm. S1 and S2 normal.  Lungs  Clear to auscultation bilateraly, without wheezes/crackles/rhonchi. Good air movement.  Skin  No rash/lesions/breakdown  Psychiatry  Alert and oriented to person, place, and time  Abdomen  Normoactive bowel sounds, abdomen soft, non-tender and obese without evidence of hernia. Incisions well healed.  Back No CVA tenderness Genito Urinary  deferred Rectal  deferred Extremities  No bilateral cyanosis, clubbing  or edema.   Thereasa Solo, MD  06/24/2018, 11:50  AM     

## 2018-07-27 DIAGNOSIS — Z23 Encounter for immunization: Secondary | ICD-10-CM | POA: Diagnosis not present

## 2018-08-11 DIAGNOSIS — Z0000 Encounter for general adult medical examination without abnormal findings: Secondary | ICD-10-CM | POA: Diagnosis not present

## 2018-08-11 DIAGNOSIS — E7800 Pure hypercholesterolemia, unspecified: Secondary | ICD-10-CM | POA: Diagnosis not present

## 2018-08-11 DIAGNOSIS — R42 Dizziness and giddiness: Secondary | ICD-10-CM | POA: Diagnosis not present

## 2018-08-11 DIAGNOSIS — I10 Essential (primary) hypertension: Secondary | ICD-10-CM | POA: Diagnosis not present

## 2018-08-19 ENCOUNTER — Ambulatory Visit: Payer: BLUE CROSS/BLUE SHIELD | Admitting: Certified Nurse Midwife

## 2018-08-31 DIAGNOSIS — I10 Essential (primary) hypertension: Secondary | ICD-10-CM | POA: Diagnosis not present

## 2018-08-31 DIAGNOSIS — E7800 Pure hypercholesterolemia, unspecified: Secondary | ICD-10-CM | POA: Diagnosis not present

## 2018-08-31 DIAGNOSIS — I2510 Atherosclerotic heart disease of native coronary artery without angina pectoris: Secondary | ICD-10-CM | POA: Diagnosis not present

## 2018-09-01 DIAGNOSIS — Z1389 Encounter for screening for other disorder: Secondary | ICD-10-CM | POA: Diagnosis not present

## 2018-09-01 DIAGNOSIS — Z713 Dietary counseling and surveillance: Secondary | ICD-10-CM | POA: Diagnosis not present

## 2018-09-01 DIAGNOSIS — E785 Hyperlipidemia, unspecified: Secondary | ICD-10-CM | POA: Diagnosis not present

## 2018-09-01 DIAGNOSIS — E668 Other obesity: Secondary | ICD-10-CM | POA: Diagnosis not present

## 2018-09-01 DIAGNOSIS — F331 Major depressive disorder, recurrent, moderate: Secondary | ICD-10-CM | POA: Diagnosis not present

## 2018-09-01 DIAGNOSIS — Z6831 Body mass index (BMI) 31.0-31.9, adult: Secondary | ICD-10-CM | POA: Diagnosis not present

## 2018-09-01 DIAGNOSIS — Z1331 Encounter for screening for depression: Secondary | ICD-10-CM | POA: Diagnosis not present

## 2018-09-01 DIAGNOSIS — I2510 Atherosclerotic heart disease of native coronary artery without angina pectoris: Secondary | ICD-10-CM | POA: Diagnosis not present

## 2018-09-01 DIAGNOSIS — I119 Hypertensive heart disease without heart failure: Secondary | ICD-10-CM | POA: Diagnosis not present

## 2018-09-15 DIAGNOSIS — I119 Hypertensive heart disease without heart failure: Secondary | ICD-10-CM | POA: Diagnosis not present

## 2018-09-15 DIAGNOSIS — Z1389 Encounter for screening for other disorder: Secondary | ICD-10-CM | POA: Diagnosis not present

## 2018-09-15 DIAGNOSIS — I2510 Atherosclerotic heart disease of native coronary artery without angina pectoris: Secondary | ICD-10-CM | POA: Diagnosis not present

## 2018-09-15 DIAGNOSIS — E663 Overweight: Secondary | ICD-10-CM | POA: Diagnosis not present

## 2018-09-15 DIAGNOSIS — Z6829 Body mass index (BMI) 29.0-29.9, adult: Secondary | ICD-10-CM | POA: Diagnosis not present

## 2018-09-15 DIAGNOSIS — E785 Hyperlipidemia, unspecified: Secondary | ICD-10-CM | POA: Diagnosis not present

## 2018-09-15 DIAGNOSIS — F331 Major depressive disorder, recurrent, moderate: Secondary | ICD-10-CM | POA: Diagnosis not present

## 2018-09-15 DIAGNOSIS — Z713 Dietary counseling and surveillance: Secondary | ICD-10-CM | POA: Diagnosis not present

## 2018-09-30 DIAGNOSIS — F331 Major depressive disorder, recurrent, moderate: Secondary | ICD-10-CM | POA: Diagnosis not present

## 2018-09-30 DIAGNOSIS — R7301 Impaired fasting glucose: Secondary | ICD-10-CM | POA: Diagnosis not present

## 2018-09-30 DIAGNOSIS — E663 Overweight: Secondary | ICD-10-CM | POA: Diagnosis not present

## 2018-09-30 DIAGNOSIS — Z6829 Body mass index (BMI) 29.0-29.9, adult: Secondary | ICD-10-CM | POA: Diagnosis not present

## 2018-09-30 DIAGNOSIS — I2510 Atherosclerotic heart disease of native coronary artery without angina pectoris: Secondary | ICD-10-CM | POA: Diagnosis not present

## 2018-09-30 DIAGNOSIS — Z1389 Encounter for screening for other disorder: Secondary | ICD-10-CM | POA: Diagnosis not present

## 2018-09-30 DIAGNOSIS — I119 Hypertensive heart disease without heart failure: Secondary | ICD-10-CM | POA: Diagnosis not present

## 2018-09-30 DIAGNOSIS — Z713 Dietary counseling and surveillance: Secondary | ICD-10-CM | POA: Diagnosis not present

## 2018-11-27 DIAGNOSIS — Z79899 Other long term (current) drug therapy: Secondary | ICD-10-CM | POA: Diagnosis not present

## 2018-11-27 DIAGNOSIS — E782 Mixed hyperlipidemia: Secondary | ICD-10-CM | POA: Diagnosis not present

## 2018-11-27 DIAGNOSIS — R7301 Impaired fasting glucose: Secondary | ICD-10-CM | POA: Diagnosis not present

## 2018-11-27 DIAGNOSIS — B078 Other viral warts: Secondary | ICD-10-CM | POA: Diagnosis not present

## 2018-11-27 DIAGNOSIS — D492 Neoplasm of unspecified behavior of bone, soft tissue, and skin: Secondary | ICD-10-CM | POA: Diagnosis not present

## 2018-12-08 DIAGNOSIS — Z713 Dietary counseling and surveillance: Secondary | ICD-10-CM | POA: Diagnosis not present

## 2018-12-08 DIAGNOSIS — Z6828 Body mass index (BMI) 28.0-28.9, adult: Secondary | ICD-10-CM | POA: Diagnosis not present

## 2018-12-08 DIAGNOSIS — Z1389 Encounter for screening for other disorder: Secondary | ICD-10-CM | POA: Diagnosis not present

## 2018-12-08 DIAGNOSIS — I119 Hypertensive heart disease without heart failure: Secondary | ICD-10-CM | POA: Diagnosis not present

## 2018-12-08 DIAGNOSIS — E663 Overweight: Secondary | ICD-10-CM | POA: Diagnosis not present

## 2018-12-08 DIAGNOSIS — R7301 Impaired fasting glucose: Secondary | ICD-10-CM | POA: Diagnosis not present

## 2018-12-08 DIAGNOSIS — I2510 Atherosclerotic heart disease of native coronary artery without angina pectoris: Secondary | ICD-10-CM | POA: Diagnosis not present

## 2018-12-08 DIAGNOSIS — F331 Major depressive disorder, recurrent, moderate: Secondary | ICD-10-CM | POA: Diagnosis not present

## 2019-03-10 DIAGNOSIS — Z955 Presence of coronary angioplasty implant and graft: Secondary | ICD-10-CM | POA: Diagnosis not present

## 2019-03-10 DIAGNOSIS — M898X1 Other specified disorders of bone, shoulder: Secondary | ICD-10-CM | POA: Insufficient documentation

## 2019-03-11 DIAGNOSIS — R079 Chest pain, unspecified: Secondary | ICD-10-CM | POA: Diagnosis not present

## 2019-03-11 DIAGNOSIS — I2510 Atherosclerotic heart disease of native coronary artery without angina pectoris: Secondary | ICD-10-CM | POA: Diagnosis not present

## 2019-05-13 DIAGNOSIS — L821 Other seborrheic keratosis: Secondary | ICD-10-CM | POA: Diagnosis not present

## 2019-05-13 DIAGNOSIS — L739 Follicular disorder, unspecified: Secondary | ICD-10-CM | POA: Diagnosis not present

## 2019-05-13 DIAGNOSIS — L700 Acne vulgaris: Secondary | ICD-10-CM | POA: Diagnosis not present

## 2019-05-13 DIAGNOSIS — D1801 Hemangioma of skin and subcutaneous tissue: Secondary | ICD-10-CM | POA: Diagnosis not present

## 2019-06-29 DIAGNOSIS — Z1231 Encounter for screening mammogram for malignant neoplasm of breast: Secondary | ICD-10-CM | POA: Diagnosis not present

## 2019-07-09 ENCOUNTER — Other Ambulatory Visit: Payer: Self-pay

## 2019-07-12 ENCOUNTER — Other Ambulatory Visit (HOSPITAL_COMMUNITY)
Admission: RE | Admit: 2019-07-12 | Discharge: 2019-07-12 | Disposition: A | Discharge status: Home / Self Care | Payer: BC Managed Care – PPO | Source: Ambulatory Visit | Attending: Certified Nurse Midwife | Admitting: Certified Nurse Midwife

## 2019-07-12 ENCOUNTER — Other Ambulatory Visit: Payer: Self-pay

## 2019-07-12 ENCOUNTER — Ambulatory Visit: Payer: BC Managed Care – PPO | Admitting: Certified Nurse Midwife

## 2019-07-12 ENCOUNTER — Encounter: Payer: Self-pay | Admitting: Certified Nurse Midwife

## 2019-07-12 VITALS — BP 112/74 | HR 64 | Temp 97.4°F | Resp 16 | Ht 63.25 in

## 2019-07-12 DIAGNOSIS — Z124 Encounter for screening for malignant neoplasm of cervix: Secondary | ICD-10-CM | POA: Insufficient documentation

## 2019-07-12 DIAGNOSIS — N951 Menopausal and female climacteric states: Secondary | ICD-10-CM

## 2019-07-12 DIAGNOSIS — Z01419 Encounter for gynecological examination (general) (routine) without abnormal findings: Secondary | ICD-10-CM

## 2019-07-12 NOTE — Progress Notes (Signed)
58 y.o. G33P3003 Married  Caucasian Fe here for annual exam. Denies vaginal bleeding or vaginal dryness. Has noted weight gain during the Covid restrictions. Patient declined weighing. Weighed at home was 176. 4 Pound weight gain only. Has started now with Paleo diet now and walking for exercise.Delrae Sawyers PA as needed in her area and sees new PCP for aex, GERD, anxiety /depression management and labs.  Sees Cardiology for medication management for hypertension, CAD. All stable per patient. Had left ovary and salpingectomy removal by Dr. Denman George all negative, very relieved. No other health concerns today.  Patient's last menstrual period was 05/28/2013.          Sexually active: Yes.    The current method of family planning is vasectomy.    Exercising: No.  not since hurt hip Smoker:  no  Review of Systems  Constitutional: Negative.   HENT: Negative.   Eyes: Negative.   Respiratory: Negative.   Cardiovascular: Negative.   Gastrointestinal: Negative.   Genitourinary: Negative.   Musculoskeletal: Negative.   Skin: Negative.   Neurological: Negative.   Endo/Heme/Allergies: Negative.   Psychiatric/Behavioral: Negative.     Health Maintenance: Pap:  02-23-16 neg HPV HR neg History of Abnormal Pap: yes, yrs ago MMG:  05/2019 neg per patient Self Breast exams: no Colonoscopy:  2019 BMD:   2012 TDaP:  2013 Shingles: no Pneumonia: had done Hep C and HIV: done with pcp Labs: if needed   reports that she has quit smoking. She quit after 30.00 years of use. She has never used smokeless tobacco. She reports current alcohol use of about 8.0 standard drinks of alcohol per week. She reports that she does not use drugs.  Past Medical History:  Diagnosis Date  . Abnormal Pap smear of cervix 1994   cin3  . Complication of anesthesia   . Depression   . Fibromyalgia   . Heart attack ([place])   . Hypertension   . PONV (postoperative nausea and vomiting)     Past Surgical History:   Procedure Laterality Date  . ABDOMINOPLASTY/PANNICULECTOMY WITH LIPOSUCTION  2010  . APPENDECTOMY    . BACK SURGERY  1996  . BOWEL RESECTION  04/2003   ruptured appendix with abscess and small and large bowel resection  . CERVICAL CONE BIOPSY  1993?  Marland Kitchen LYSIS OF ADHESION N/A 06/02/2018   Procedure: LYSIS OF ADHESION;  Surgeon: Everitt Amber, MD;  Location: [facility];  Service: Gynecology;  Laterality: N/A;  . ROBOTIC ASSISTED SALPINGO OOPHERECTOMY N/A 06/02/2018   Procedure: XI ROBOTIC ASSISTED UNILATERAL SALPINGO OOPHORECTOMY;  Surgeon: Everitt Amber, MD;  Location: [facility];  Service: Gynecology;  Laterality: N/A;  . salivary gland removal Right 7/13    Current Outpatient Medications  Medication Sig Dispense Refill  . ALPRAZolam (XANAX) 0.5 MG tablet Take 0.5 mg by mouth at bedtime as needed for sleep.    Marland Kitchen aspirin 81 MG chewable tablet Chew 81 mg by mouth daily.    . carisoprodol (SOMA) 350 MG tablet Take 350 mg by mouth as needed for muscle spasms  0  . carvedilol (COREG) 3.125 MG tablet Take 3.125 mg by mouth once daily at night    . cetirizine (ZYRTEC) 10 MG tablet Take 10 mg by mouth as needed.     . Cholecalciferol (VITAMIN D) 2000 units tablet Take 2,000 Units by mouth every evening.    . cyanocobalamin (,VITAMIN B-12,) 1000 MCG/ML injection INJECT 1 ML INTO MUSCLE Q 2-3 WEEKS UTD  5  .  diazepam (VALIUM) 2 MG tablet Take 2 mg by mouth daily as needed (vertigo).    Marland Kitchen escitalopram (LEXAPRO) 20 MG tablet Take 10 mg by mouth daily.     . fexofenadine (ALLEGRA) 180 MG tablet Take 180 mg by mouth daily as needed for allergies or rhinitis.    . fluticasone (FLONASE) 50 MCG/ACT nasal spray Use 2 sprays in each nostril once daily as needed for allergies  1  . hyoscyamine (LEVSIN SL) 0.125 MG SL tablet Take 0.25 mg by mouth daily as needed for cramping.     Marland Kitchen HYZAAR 100-25 MG tablet Take 1 tablet by mouth once daily  2  . ibuprofen (ADVIL,MOTRIN) 200 MG tablet Take 400 mg by mouth daily as needed for  headache or moderate pain.    . metroNIDAZOLE (METROGEL) 1 % gel Apply 1 application topically daily as needed (Rosacea). Apply to face    . nitroGLYCERIN (NITROSTAT) 0.4 MG SL tablet Place 0.4 mg under the tongue every 5 (five) minutes as needed for chest pain.     Marland Kitchen omeprazole (PRILOSEC) 40 MG capsule Take 40 mg by mouth daily.    . Probiotic Product (ALIGN) 4 MG CAPS Take 4 mg by mouth daily as needed (ibs symptoms).    . rosuvastatin (CRESTOR) 20 MG tablet Take 20 mg by mouth daily.     No current facility-administered medications for this visit.     Family History  Problem Relation Age of Onset  . Hypertension Mother   . Stroke Mother   . Cancer Father        lung  . Hypertension Sister   . Hypertension Brother   . Multiple sclerosis Brother   . Hypertension Maternal Grandmother   . Anuerysm Maternal Grandmother 72  . Colon cancer Maternal Aunt   . Heart disease Maternal Grandfather   . Other Paternal Grandmother 88  . Heart attack Paternal Grandfather 45    ROS:  Pertinent items are noted in HPI.  Otherwise, a comprehensive ROS was negative.  Exam:   LMP 05/28/2013    Ht Readings from Last 3 Encounters:  06/24/18 5\' 3"  (1.6 m)  06/16/18 5\' 3"  (1.6 m)  06/02/18 5\' 4"  (1.626 m)    General appearance: alert, cooperative and appears stated age Head: Normocephalic, without obvious abnormality, atraumatic Neck: no adenopathy, supple, symmetrical, trachea midline and thyroid normal to inspection and palpation Lungs: clear to auscultation bilaterally Breasts: normal appearance, no masses or tenderness, No nipple retraction or dimpling, No nipple discharge or bleeding, No axillary or supraclavicular adenopathy Heart: regular rate and rhythm Abdomen: soft, non-tender; no masses,  no organomegaly Extremities: extremities normal, atraumatic, no cyanosis or edema Skin: Skin color, texture, turgor normal. No rashes or lesions Lymph nodes: Cervical, supraclavicular, and axillary  nodes normal. No abnormal inguinal nodes palpated Neurologic: Grossly normal   Pelvic: External genitalia:  no lesions              Urethra:  normal appearing urethra with no masses, tenderness or lesions              Bartholin's and Skene's: normal                 Vagina: normal appearing vagina with normal color and discharge, no lesions              Cervix: no cervical motion tenderness, no lesions and normal appearance              Pap taken:  Yes.   Bimanual Exam:  Uterus:  normal size, contour, position, consistency, mobility, non-tender and mid position              Adnexa: normal adnexa, no mass, fullness, tenderness and left adnexa surgically absent               Rectovaginal: Confirms               Anus:  normal sphincter tone, no lesions  Chaperone present: yes  A:  Well Woman with normal exam  Menopausal s/p left ovary/salpingectomy removal  Coronary Artery Disease with cardiology management  Anxiety/depression management with PCP  Weight loss in progress, working on diet and exercise  P:   Reviewed health and wellness pertinent to exam  Aware of need to advise if vaginal bleeding or dryness issues  Continue follow up with MD regarding health issues as indicated  Encouraged to continue weight loss journey for improved health.  Pap smear: yes   counseled on breast self exam, mammography screening, feminine hygiene, adequate intake of calcium and vitamin D, diet and exercise, Kegel's exercises  return annually or prn  An After Visit Summary was printed and given to the patient.

## 2019-07-12 NOTE — Patient Instructions (Signed)
EXERCISE AND DIET:  We recommended that you start or continue a regular exercise program for good health. Regular exercise means any activity that makes your heart beat faster and makes you sweat.  We recommend exercising at least 30 minutes per day at least 3 days a week, preferably 4 or 5.  We also recommend a diet low in fat and sugar.  Inactivity, poor dietary choices and obesity can cause diabetes, heart attack, stroke, and kidney damage, among others.   ° °ALCOHOL AND SMOKING:  Women should limit their alcohol intake to no more than 7 drinks/beers/glasses of wine (combined, not each!) per week. Moderation of alcohol intake to this level decreases your risk of breast cancer and liver damage. And of course, no recreational drugs are part of a healthy lifestyle.  And absolutely no smoking or even second hand smoke. Most people know smoking can cause heart and lung diseases, but did you know it also contributes to weakening of your bones? Aging of your skin?  Yellowing of your teeth and nails? ° °CALCIUM AND VITAMIN D:  Adequate intake of calcium and Vitamin D are recommended.  The recommendations for exact amounts of these supplements seem to change often, but generally speaking 600 mg of calcium (either carbonate or citrate) and 800 units of Vitamin D per day seems prudent. Certain women may benefit from higher intake of Vitamin D.  If you are among these women, your doctor will have told you during your visit.   ° °PAP SMEARS:  Pap smears, to check for cervical cancer or precancers,  have traditionally been done yearly, although recent scientific advances have shown that most women can have pap smears less often.  However, every woman still should have a physical exam from her gynecologist every year. It will include a breast check, inspection of the vulva and vagina to check for abnormal growths or skin changes, a visual exam of the cervix, and then an exam to evaluate the size and shape of the uterus and  ovaries.  And after 60 years of age, a rectal exam is indicated to check for rectal cancers. We will also provide age appropriate advice regarding health maintenance, like when you should have certain vaccines, screening for sexually transmitted diseases, bone density testing, colonoscopy, mammograms, etc.  ° °MAMMOGRAMS:  All women over 40 years old should have a yearly mammogram. Many facilities now offer a "3D" mammogram, which may cost around $50 extra out of pocket. If possible,  we recommend you accept the option to have the 3D mammogram performed.  It both reduces the number of women who will be called back for extra views which then turn out to be normal, and it is better than the routine mammogram at detecting truly abnormal areas.   ° °COLONOSCOPY:  Colonoscopy to screen for colon cancer is recommended for all women at age 50.  We know, you hate the idea of the prep.  We agree, BUT, having colon cancer and not knowing it is worse!!  Colon cancer so often starts as a polyp that can be seen and removed at colonscopy, which can quite literally save your life!  And if your first colonoscopy is normal and you have no family history of colon cancer, most women don't have to have it again for 10 years.  Once every ten years, you can do something that may end up saving your life, right?  We will be happy to help you get it scheduled when you are ready.    Be sure to check your insurance coverage so you understand how much it will cost.  It may be covered as a preventative service at no cost, but you should check your particular policy.      Exercising to Lose Weight Exercise is structured, repetitive physical activity to improve fitness and health. Getting regular exercise is important for everyone. It is especially important if you are overweight. Being overweight increases your risk of heart disease, stroke, diabetes, high blood pressure, and several types of cancer. Reducing your calorie intake and  exercising can help you lose weight. Exercise is usually categorized as moderate or vigorous intensity. To lose weight, most people need to do a certain amount of moderate-intensity or vigorous-intensity exercise each week. Moderate-intensity exercise  Moderate-intensity exercise is any activity that gets you moving enough to burn at least three times more energy (calories) than if you were sitting. Examples of moderate exercise include:  Walking a mile in 15 minutes.  Doing light yard work.  Biking at an easy pace. Most people should get at least 150 minutes (2 hours and 30 minutes) a week of moderate-intensity exercise to maintain their body weight. Vigorous-intensity exercise Vigorous-intensity exercise is any activity that gets you moving enough to burn at least six times more calories than if you were sitting. When you exercise at this intensity, you should be working hard enough that you are not able to carry on a conversation. Examples of vigorous exercise include:  Running.  Playing a team sport, such as football, basketball, and soccer.  Jumping rope. Most people should get at least 75 minutes (1 hour and 15 minutes) a week of vigorous-intensity exercise to maintain their body weight. How can exercise affect me? When you exercise enough to burn more calories than you eat, you lose weight. Exercise also reduces body fat and builds muscle. The more muscle you have, the more calories you burn. Exercise also:  Improves mood.  Reduces stress and tension.  Improves your overall fitness, flexibility, and endurance.  Increases bone strength. The amount of exercise you need to lose weight depends on:  Your age.  The type of exercise.  Any health conditions you have.  Your overall physical ability. Talk to your health care provider about how much exercise you need and what types of activities are safe for you. What actions can I take to lose weight? Nutrition   Make  changes to your diet as told by your health care provider or diet and nutrition specialist (dietitian). This may include: ? Eating fewer calories. ? Eating more protein. ? Eating less unhealthy fats. ? Eating a diet that includes fresh fruits and vegetables, whole grains, low-fat dairy products, and lean protein. ? Avoiding foods with added fat, salt, and sugar.  Drink plenty of water while you exercise to prevent dehydration or heat stroke. Activity  Choose an activity that you enjoy and set realistic goals. Your health care provider can help you make an exercise plan that works for you.  Exercise at a moderate or vigorous intensity most days of the week. ? The intensity of exercise may vary from person to person. You can tell how intense a workout is for you by paying attention to your breathing and heartbeat. Most people will notice their breathing and heartbeat get faster with more intense exercise.  Do resistance training twice each week, such as: ? Push-ups. ? Sit-ups. ? Lifting weights. ? Using resistance bands.  Getting short amounts of exercise can be just as  helpful as long structured periods of exercise. If you have trouble finding time to exercise, try to include exercise in your daily routine. ? Get up, stretch, and walk around every 30 minutes throughout the day. ? Go for a walk during your lunch break. ? Park your car farther away from your destination. ? If you take public transportation, get off one stop early and walk the rest of the way. ? Make phone calls while standing up and walking around. ? Take the stairs instead of elevators or escalators.  Wear comfortable clothes and shoes with good support.  Do not exercise so much that you hurt yourself, feel dizzy, or get very short of breath. Where to find more information  U.S. Department of Health and Human Services: [employer]at  Centers for Disease Control and Prevention (CDC): http://www.wolf.info/ Contact a health care  provider:  Before starting a new exercise program.  If you have questions or concerns about your weight.  If you have a medical problem that keeps you from exercising. Get help right away if you have any of the following while exercising:  Injury.  Dizziness.  Difficulty breathing or shortness of breath that does not go away when you stop exercising.  Chest pain.  Rapid heartbeat. Summary  Being overweight increases your risk of heart disease, stroke, diabetes, high blood pressure, and several types of cancer.  Losing weight happens when you burn more calories than you eat.  Reducing the amount of calories you eat in addition to getting regular moderate or vigorous exercise each week helps you lose weight. This information is not intended to replace advice given to you by your health care provider. Make sure you discuss any questions you have with your health care provider. Document Released: 11/16/2010 Document Revised: 10/27/2017 Document Reviewed: 10/27/2017 Elsevier Patient Education  2020 [employer].

## 2019-07-14 LAB — CYTOLOGY - PAP: Diagnosis: NEGATIVE

## 2019-09-01 DIAGNOSIS — Z955 Presence of coronary angioplasty implant and graft: Secondary | ICD-10-CM | POA: Diagnosis not present

## 2019-09-01 DIAGNOSIS — I2510 Atherosclerotic heart disease of native coronary artery without angina pectoris: Secondary | ICD-10-CM | POA: Diagnosis not present

## 2019-09-01 DIAGNOSIS — E7800 Pure hypercholesterolemia, unspecified: Secondary | ICD-10-CM | POA: Diagnosis not present

## 2019-09-01 DIAGNOSIS — I10 Essential (primary) hypertension: Secondary | ICD-10-CM | POA: Diagnosis not present

## 2019-09-06 DIAGNOSIS — I10 Essential (primary) hypertension: Secondary | ICD-10-CM | POA: Diagnosis not present

## 2019-09-06 DIAGNOSIS — Z0000 Encounter for general adult medical examination without abnormal findings: Secondary | ICD-10-CM | POA: Diagnosis not present

## 2019-09-06 DIAGNOSIS — E7800 Pure hypercholesterolemia, unspecified: Secondary | ICD-10-CM | POA: Diagnosis not present

## 2019-10-09 DIAGNOSIS — Z20828 Contact with and (suspected) exposure to other viral communicable diseases: Secondary | ICD-10-CM | POA: Diagnosis not present

## 2019-10-19 DIAGNOSIS — E782 Mixed hyperlipidemia: Secondary | ICD-10-CM | POA: Diagnosis not present

## 2019-10-19 DIAGNOSIS — Z79899 Other long term (current) drug therapy: Secondary | ICD-10-CM | POA: Diagnosis not present

## 2019-10-19 DIAGNOSIS — R7301 Impaired fasting glucose: Secondary | ICD-10-CM | POA: Diagnosis not present

## 2019-12-14 DIAGNOSIS — Z20828 Contact with and (suspected) exposure to other viral communicable diseases: Secondary | ICD-10-CM | POA: Diagnosis not present

## 2019-12-14 DIAGNOSIS — Z03818 Encounter for observation for suspected exposure to other biological agents ruled out: Secondary | ICD-10-CM | POA: Diagnosis not present

## 2020-01-18 DIAGNOSIS — Z79899 Other long term (current) drug therapy: Secondary | ICD-10-CM | POA: Diagnosis not present

## 2020-01-18 DIAGNOSIS — E782 Mixed hyperlipidemia: Secondary | ICD-10-CM | POA: Diagnosis not present

## 2020-01-19 ENCOUNTER — Encounter: Payer: Self-pay | Admitting: Certified Nurse Midwife

## 2020-03-20 IMAGING — DX DG CHEST 2V
2 series · 2 of 2 positions shown · non-contrast
Comparison: None.

CLINICAL DATA: Nonproductive cough.  Chest pain.  Nausea.

EXAM:
CHEST - 2 VIEW

[dg chest 2 view (1 of 2)]
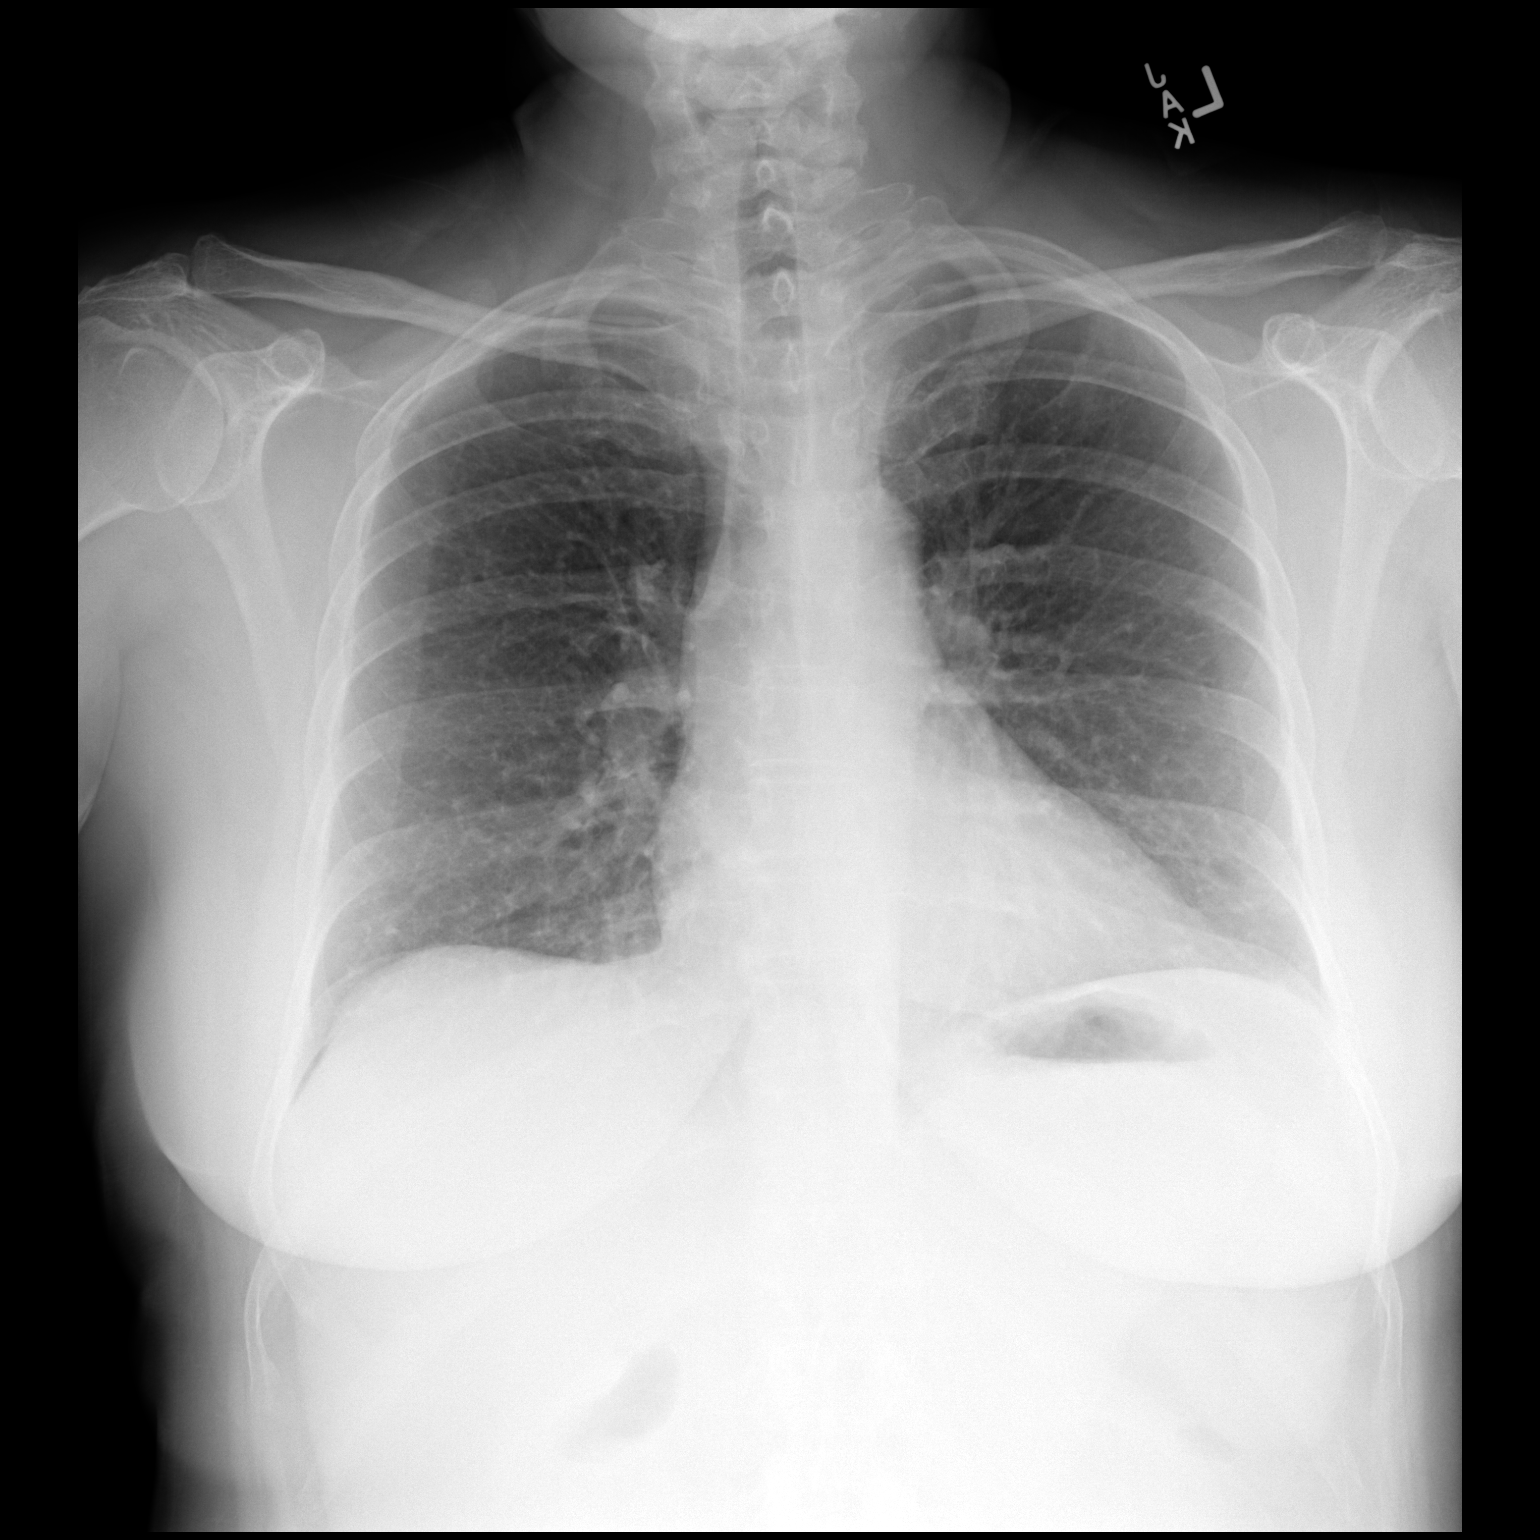

[dg chest 2 view (2 of 2)]
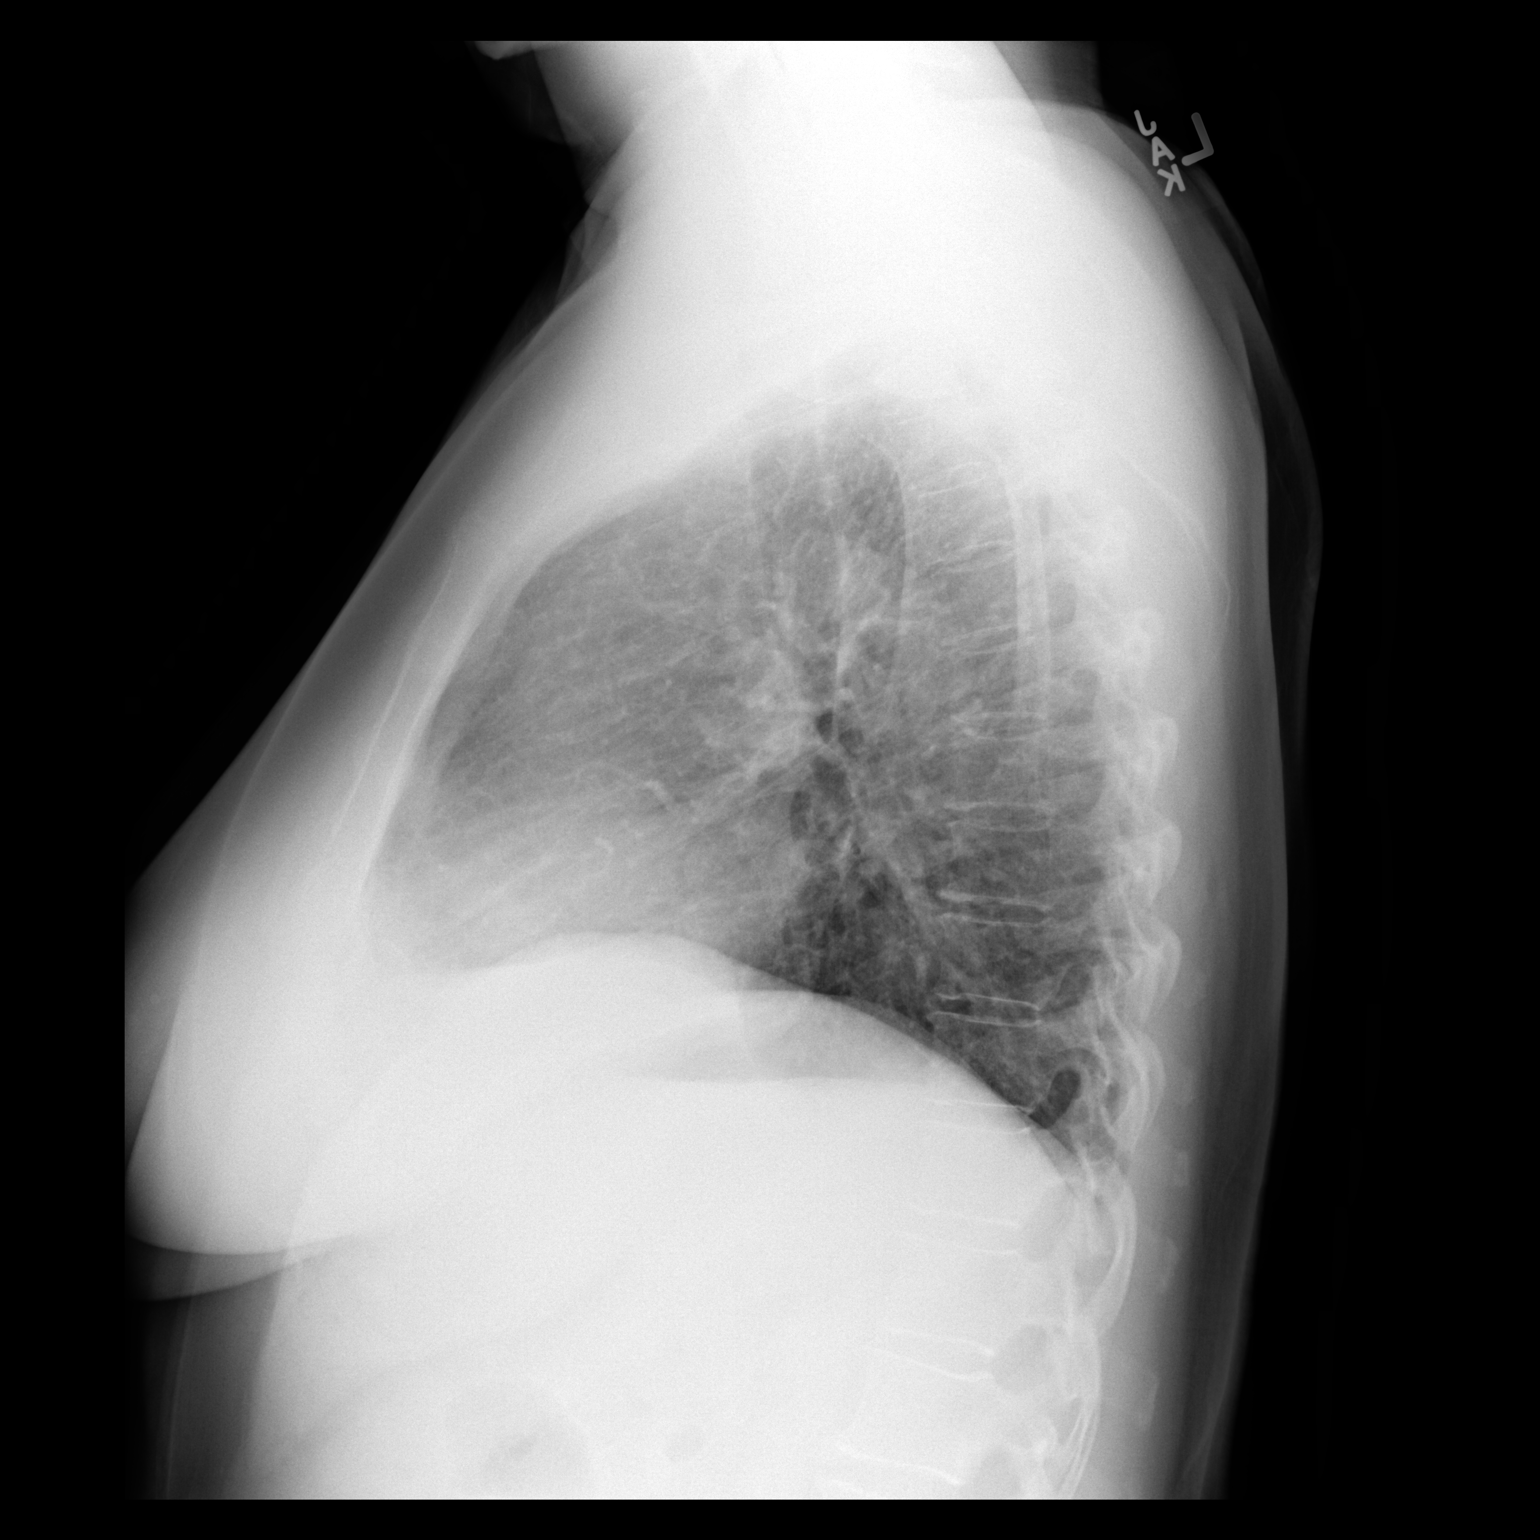

[2 of 2 positions shown; findings below may reference images not displayed]

FINDINGS: The heart size and mediastinal contours are within normal limits.
Both lungs are clear. The visualized skeletal structures are
unremarkable.
IMPRESSION: No active cardiopulmonary disease.

## 2020-05-15 DIAGNOSIS — L578 Other skin changes due to chronic exposure to nonionizing radiation: Secondary | ICD-10-CM | POA: Diagnosis not present

## 2020-05-15 DIAGNOSIS — D225 Melanocytic nevi of trunk: Secondary | ICD-10-CM | POA: Diagnosis not present

## 2020-05-15 DIAGNOSIS — D1801 Hemangioma of skin and subcutaneous tissue: Secondary | ICD-10-CM | POA: Diagnosis not present

## 2020-07-12 ENCOUNTER — Ambulatory Visit: Payer: BC Managed Care – PPO | Admitting: Certified Nurse Midwife

## 2020-09-06 DIAGNOSIS — E7800 Pure hypercholesterolemia, unspecified: Secondary | ICD-10-CM | POA: Diagnosis not present

## 2020-09-06 DIAGNOSIS — Z0000 Encounter for general adult medical examination without abnormal findings: Secondary | ICD-10-CM | POA: Diagnosis not present

## 2020-09-06 DIAGNOSIS — Z23 Encounter for immunization: Secondary | ICD-10-CM | POA: Diagnosis not present

## 2020-10-03 DIAGNOSIS — Z1231 Encounter for screening mammogram for malignant neoplasm of breast: Secondary | ICD-10-CM | POA: Diagnosis not present

## 2021-02-05 ENCOUNTER — Telehealth: Payer: Self-pay | Admitting: *Deleted

## 2021-02-05 NOTE — Telephone Encounter (Signed)
Returned call from 10:30 AM. Left a message to call office back to schedule a New GYN, annual appointment.

## 2021-02-05 NOTE — Telephone Encounter (Signed)
Left patient a message to call and schedule New GYN annual appointment. Last annual was 07/12/2019 with [facility].

## 2021-03-01 ENCOUNTER — Other Ambulatory Visit (HOSPITAL_COMMUNITY)
Admission: RE | Admit: 2021-03-01 | Discharge: 2021-03-01 | Disposition: A | Discharge status: Home / Self Care | Payer: BLUE CROSS/BLUE SHIELD | Source: Ambulatory Visit | Attending: Obstetrics & Gynecology | Admitting: Obstetrics & Gynecology

## 2021-03-01 ENCOUNTER — Ambulatory Visit (INDEPENDENT_AMBULATORY_CARE_PROVIDER_SITE_OTHER): Payer: BLUE CROSS/BLUE SHIELD | Admitting: Obstetrics & Gynecology

## 2021-03-01 ENCOUNTER — Encounter: Payer: Self-pay | Admitting: Obstetrics & Gynecology

## 2021-03-01 ENCOUNTER — Other Ambulatory Visit: Payer: Self-pay

## 2021-03-01 VITALS — BP 129/84 | HR 87 | Ht 64.0 in | Wt 178.0 lb

## 2021-03-01 DIAGNOSIS — Z01419 Encounter for gynecological examination (general) (routine) without abnormal findings: Secondary | ICD-10-CM

## 2021-03-01 DIAGNOSIS — Z780 Asymptomatic menopausal state: Secondary | ICD-10-CM | POA: Diagnosis not present

## 2021-03-01 DIAGNOSIS — Z124 Encounter for screening for malignant neoplasm of cervix: Secondary | ICD-10-CM | POA: Diagnosis not present

## 2021-03-01 DIAGNOSIS — D219 Benign neoplasm of connective and other soft tissue, unspecified: Secondary | ICD-10-CM

## 2021-03-01 DIAGNOSIS — K635 Polyp of colon: Secondary | ICD-10-CM

## 2021-03-01 NOTE — Patient Instructions (Signed)
Please do your Hep C antibody testing with your next blood work

## 2021-03-01 NOTE — Progress Notes (Signed)
59 y.o. G50P3003 Married White or Caucasian female here for annual exam.  Former patient of Debbi Teacher, early years/pre at [employer].  Lives here in [place].    Denies vaginal bleeding.  No HRT.  Patient's last menstrual period was 05/28/2013.          Sexually active: Yes.    The current method of family planning is post menopausal status.    Exercising: Yes.     Smoker:  former  Health Maintenance: Pap:  Will obtain today History of abnormal Pap:  1993 MMG:  Done at [facility] Colonoscopy:  11/2017 follow up 5 years BMD:   Guidelines reviewed TDaP:  2013 Pneumonia vaccine(s):  Has done pneumovax Hep C testing: not in Epic, will have PCP do this Screening Labs: done   reports that she has quit smoking. She quit after 30.00 years of use. She has never used smokeless tobacco. She reports current alcohol use of about 8.0 standard drinks of alcohol per week. She reports that she does not use drugs.  Past Medical History:  Diagnosis Date  . Abnormal Pap smear of cervix 1994   cin3  . Complication of anesthesia   . Depression   . Fibromyalgia   . Heart attack ([place])   . Hypertension   . Ovarian cyst   . PONV (postoperative nausea and vomiting)     Past Surgical History:  Procedure Laterality Date  . ABDOMINOPLASTY/PANNICULECTOMY WITH LIPOSUCTION  2010  . APPENDECTOMY    . BACK SURGERY  1996  . BOWEL RESECTION  04/2003   ruptured appendix with abscess and small and large bowel resection  . CERVICAL CONE BIOPSY  1993?  Marland Kitchen LYSIS OF ADHESION N/A 06/02/2018   Procedure: LYSIS OF ADHESION;  Surgeon: Everitt Amber, MD;  Location: [facility];  Service: Gynecology;  Laterality: N/A;  . OVARIAN CYST SURGERY    . ROBOTIC ASSISTED SALPINGO OOPHERECTOMY N/A 06/02/2018   Procedure: XI ROBOTIC ASSISTED UNILATERAL SALPINGO OOPHORECTOMY;  Surgeon: Everitt Amber, MD;  Location: [facility];  Service: Gynecology;  Laterality: N/A;  . salivary gland removal Right 7/13    Current Outpatient Medications   Medication Sig Dispense Refill  . aspirin 81 MG chewable tablet Chew 81 mg by mouth daily.    . Cholecalciferol (VITAMIN D) 2000 units tablet Take 2,000 Units by mouth every evening.    . cyanocobalamin (,VITAMIN B-12,) 1000 MCG/ML injection   5  . escitalopram (LEXAPRO) 10 MG tablet Lexapro 10 mg tablet  Take 1 tablet every day by oral route.    Marland Kitchen losartan (COZAAR) 50 MG tablet Take by mouth.    . Multiple Vitamins-Minerals (ZINC PO) Take by mouth.    . Omega-3 Fatty Acids (FISH OIL PO) Take by mouth.    . Probiotic Product (ALIGN) 4 MG CAPS Take 4 mg by mouth daily as needed (ibs symptoms).    . carisoprodol (SOMA) 350 MG tablet Take 350 mg by mouth as needed for muscle spasms (Patient not taking: Reported on 03/01/2021)  0  . cetirizine (ZYRTEC) 10 MG tablet Take 10 mg by mouth as needed.  (Patient not taking: Reported on 03/01/2021)    . diazepam (VALIUM) 2 MG tablet Take 2 mg by mouth daily as needed (vertigo). (Patient not taking: Reported on 03/01/2021)    . fexofenadine (ALLEGRA) 180 MG tablet Take 180 mg by mouth daily as needed for allergies or rhinitis. (Patient not taking: Reported on 03/01/2021)    . fluticasone (FLONASE) 50 MCG/ACT nasal spray Use  2 sprays in each nostril once daily as needed for allergies (Patient not taking: Reported on 03/01/2021)  1  . hydrochlorothiazide (HYDRODIURIL) 25 MG tablet  (Patient not taking: Reported on 03/01/2021)    . hyoscyamine (LEVSIN SL) 0.125 MG SL tablet Take 0.25 mg by mouth daily as needed for cramping.  (Patient not taking: Reported on 03/01/2021)    . ibuprofen (ADVIL,MOTRIN) 200 MG tablet Take 400 mg by mouth daily as needed for headache or moderate pain. (Patient not taking: Reported on 03/01/2021)    . metroNIDAZOLE (METROGEL) 1 % gel Apply 1 application topically daily as needed (Rosacea). Apply to face (Patient not taking: Reported on 03/01/2021)    . nitroGLYCERIN (NITROSTAT) 0.4 MG SL tablet Place 0.4 mg under the tongue every 5 (five) minutes as  needed for chest pain.  (Patient not taking: Reported on 03/01/2021)    . omeprazole (PRILOSEC) 40 MG capsule Take 40 mg by mouth daily. (Patient not taking: Reported on 03/01/2021)    . rosuvastatin (CRESTOR) 20 MG tablet Take 20 mg by mouth daily. (Patient not taking: Reported on 03/01/2021)     No current facility-administered medications for this visit.    Family History  Problem Relation Age of Onset  . Hypertension Mother   . Stroke Mother   . Cancer Father        lung  . Hypertension Sister   . Hypertension Brother   . Multiple sclerosis Brother   . Hypertension Maternal Grandmother   . Anuerysm Maternal Grandmother 72  . Colon cancer Maternal Aunt   . Heart disease Maternal Grandfather   . Other Paternal Grandmother 52  . Heart attack Paternal Grandfather 61    Review of Systems  All other systems reviewed and are negative.   Exam:   BP 129/84   Pulse 87   Ht 5\' 4"  (1.626 m)   Wt 178 lb (80.7 kg)   LMP 05/28/2013   BMI 30.55 kg/m   Height: 5\' 4"  (162.6 cm)  General appearance: alert, cooperative and appears stated age Head: Normocephalic, without obvious abnormality, atraumatic Lungs: clear to auscultation bilaterally Breasts: normal appearance, no masses or tenderness Heart: regular rate and rhythm Abdomen: soft, non-tender; bowel sounds normal; no masses,  no organomegaly Extremities: extremities normal, atraumatic, no cyanosis or edema Skin: Skin color, texture, turgor normal. No rashes or lesions Lymph nodes: Cervical, supraclavicular, and axillary nodes normal. No abnormal inguinal nodes palpated Neurologic: Grossly normal   Pelvic: External genitalia:  no lesions              Urethra:  normal appearing urethra with no masses, tenderness or lesions              Bartholins and Skenes: normal                 Vagina: normal appearing vagina with normal color and no discharge, no lesions              Cervix: no lesions              Pap taken: Yes.   Bimanual  Exam:  Uterus:  enlarged, 8 weeks size , mobile, globular on lower right of uterus              Adnexa: no mass, fullness, tenderness               Rectovaginal: Confirms               Anus:  normal sphincter tone, no lesions  Chaperone, Deana, CMA, was present for exam.  Assessment/Plan: 1. Well woman exam with routine gynecological exam - pap and HR HPV obtained today - release for MMG will be signed today.  Done at [place] BMD this year with MMG - colonosdopy 2109,  Follow up 5 years. - vaccines reviewed - does blood work with PCP  2. Cervical cancer screening - Cytology - PAP( [place])  3. Postmenopausal - no HRT  4. Fibroids  5. Polyp of colon, unspecified part of colon, unspecified type

## 2021-03-06 LAB — CYTOLOGY - PAP
Comment: NEGATIVE
Diagnosis: NEGATIVE
High risk HPV: NEGATIVE

## 2022-02-27 ENCOUNTER — Telehealth: Payer: Self-pay

## 2022-02-27 NOTE — Telephone Encounter (Signed)
Left message on pt's voicemail letting her know it was time to schedule annual appt.  ?

## 2022-03-19 DIAGNOSIS — Z86010 Personal history of colonic polyps: Secondary | ICD-10-CM | POA: Insufficient documentation

## 2022-03-19 DIAGNOSIS — Z8742 Personal history of other diseases of the female genital tract: Secondary | ICD-10-CM | POA: Insufficient documentation

## 2022-03-19 DIAGNOSIS — K5900 Constipation, unspecified: Secondary | ICD-10-CM | POA: Insufficient documentation

## 2022-03-19 DIAGNOSIS — Q059 Spina bifida, unspecified: Secondary | ICD-10-CM | POA: Insufficient documentation

## 2022-03-19 DIAGNOSIS — J309 Allergic rhinitis, unspecified: Secondary | ICD-10-CM | POA: Insufficient documentation

## 2022-03-19 DIAGNOSIS — Z955 Presence of coronary angioplasty implant and graft: Secondary | ICD-10-CM | POA: Insufficient documentation

## 2022-03-19 DIAGNOSIS — R1310 Dysphagia, unspecified: Secondary | ICD-10-CM | POA: Insufficient documentation

## 2022-03-19 DIAGNOSIS — G4733 Obstructive sleep apnea (adult) (pediatric): Secondary | ICD-10-CM | POA: Insufficient documentation

## 2022-03-19 DIAGNOSIS — K900 Celiac disease: Secondary | ICD-10-CM | POA: Insufficient documentation

## 2022-03-19 NOTE — Progress Notes (Unsigned)
   ANNUAL EXAM Patient name: Brandi Rogers MRN [id number]  Date of birth: [birth year] Chief Complaint:   No chief complaint on file.  History of Present Illness:   Brandi Rogers is a 61 y.o. G4P3003 female being seen today for a routine annual exam.   Current complaints: ***  Patient's last menstrual period was 05/28/2013.   Last pap 02/2021. Results were: NILM w/ HRHPV negative. H/O abnormal pap: yes 1993  MMG:  Done at [facility] *** Colonoscopy:  11/2017 follow up 5 years ***      View : No data to display.               View : No data to display.           Review of Systems:   Pertinent items are noted in HPI Denies any headaches, blurred vision, fatigue, shortness of breath, chest pain, abdominal pain, abnormal vaginal discharge/itching/odor/irritation, problems with periods, bowel movements, urination, or intercourse unless otherwise stated above. *** Pertinent History Reviewed:  Reviewed past medical,surgical, social and family history.  Reviewed problem list, medications and allergies. Physical Assessment:  There were no vitals filed for this visit.There is no height or weight on file to calculate BMI.   Physical Examination:  General appearance - well appearing, and in no distress Mental status - alert, oriented to person, place, and time Psych:  She has a normal mood and affect Skin - warm and dry, normal color, no suspicious lesions noted Chest - effort normal, all lung fields clear to auscultation bilaterally Heart - normal rate and regular rhythm Neck:  midline trachea, no thyromegaly or nodules Breasts - breasts appear normal, no suspicious masses, no skin or nipple changes or  axillary nodes Abdomen - soft, nontender, nondistended, no masses or organomegaly Pelvic -  VULVA: normal appearing vulva with no masses, tenderness or lesions   VAGINA: normal appearing vagina with normal color and discharge, no lesions   CERVIX: normal appearing cervix  without discharge or lesions, no CMT UTERUS: uterus is felt to be normal size, shape, consistency and nontender  ADNEXA: No adnexal masses or tenderness noted. Extremities:  No swelling or varicosities noted  Chaperone present for exam  No results found for this or any previous visit (from the past 24 hour(s)).  Assessment & Plan:  Diagnoses and all orders for this visit:  Encounter for annual routine gynecological examination  History of abnormal cervical Papanicolaou smear  Abnormal in 1993  - Cervical cancer screening: Discussed guidelines. Pap with HPV wnl 02/2021 - Breast Health: Encouraged self breast awareness/SBE. Discussed limits of clinical breast exam for detecting breast cancer. Discussed importance of annual MXR.  Rx given *** - Climacteric/Sexual health: Reviewed typical and atypical symptoms of menopause/peri-menopause. Discussed PMB and to call if any amount of spotting.  - Bone Health: Calcium via diet and supplementation. Discussed weight bearing exercise. DEXA due by age 1.  - Colonoscopy: up to date - due in 2024.   - F/U 12 months and prn   No orders of the defined types were placed in this encounter.   Meds: No orders of the defined types were placed in this encounter.   Follow-up: No follow-ups on file.  Radene Gunning, MD 03/19/2022 12:13 PM

## 2022-03-21 ENCOUNTER — Encounter: Payer: Self-pay | Admitting: Obstetrics and Gynecology

## 2022-03-21 ENCOUNTER — Ambulatory Visit (INDEPENDENT_AMBULATORY_CARE_PROVIDER_SITE_OTHER): Payer: BLUE CROSS/BLUE SHIELD | Admitting: Obstetrics and Gynecology

## 2022-03-21 VITALS — BP 123/80 | HR 90 | Ht 64.0 in | Wt 188.0 lb

## 2022-03-21 DIAGNOSIS — Z01419 Encounter for gynecological examination (general) (routine) without abnormal findings: Secondary | ICD-10-CM | POA: Diagnosis not present

## 2022-03-21 DIAGNOSIS — Z8742 Personal history of other diseases of the female genital tract: Secondary | ICD-10-CM | POA: Diagnosis not present

## 2022-04-04 ENCOUNTER — Encounter: Payer: Self-pay | Admitting: *Deleted

## 2022-06-11 DIAGNOSIS — E7800 Pure hypercholesterolemia, unspecified: Secondary | ICD-10-CM | POA: Diagnosis not present

## 2022-06-11 DIAGNOSIS — I10 Essential (primary) hypertension: Secondary | ICD-10-CM | POA: Diagnosis not present

## 2022-06-11 DIAGNOSIS — I2510 Atherosclerotic heart disease of native coronary artery without angina pectoris: Secondary | ICD-10-CM | POA: Diagnosis not present

## 2022-06-11 DIAGNOSIS — Z955 Presence of coronary angioplasty implant and graft: Secondary | ICD-10-CM | POA: Diagnosis not present
